# Patient Record
Sex: Female | Born: 1960
Health system: Southern US, Community
[De-identification: ages and names within clinical notes are randomized; demographics above are authoritative.]

## PROBLEM LIST (undated history)

## (undated) DIAGNOSIS — E669 Obesity, unspecified: Secondary | ICD-10-CM

## (undated) DIAGNOSIS — T7840XA Allergy, unspecified, initial encounter: Secondary | ICD-10-CM

## (undated) DIAGNOSIS — E782 Mixed hyperlipidemia: Secondary | ICD-10-CM

## (undated) DIAGNOSIS — D649 Anemia, unspecified: Secondary | ICD-10-CM

## (undated) DIAGNOSIS — F988 Other specified behavioral and emotional disorders with onset usually occurring in childhood and adolescence: Secondary | ICD-10-CM

## (undated) DIAGNOSIS — K219 Gastro-esophageal reflux disease without esophagitis: Secondary | ICD-10-CM

## (undated) HISTORY — DX: Gastro-esophageal reflux disease without esophagitis: K21.9

## (undated) HISTORY — DX: Obesity, unspecified: E66.9

## (undated) HISTORY — PX: COSMETIC SURGERY: SHX468

## (undated) HISTORY — DX: Anemia, unspecified: D64.9

## (undated) HISTORY — DX: Other specified behavioral and emotional disorders with onset usually occurring in childhood and adolescence: F98.8

## (undated) HISTORY — DX: Allergy, unspecified, initial encounter: T78.40XA

## (undated) HISTORY — DX: Mixed hyperlipidemia: E78.2

---

## 2000-07-14 HISTORY — PX: GASTRIC BYPASS: SHX52

## 2014-05-02 ENCOUNTER — Ambulatory Visit (INDEPENDENT_AMBULATORY_CARE_PROVIDER_SITE_OTHER): Payer: BC Managed Care – PPO | Admitting: Internal Medicine

## 2014-05-02 ENCOUNTER — Encounter: Payer: Self-pay | Admitting: Internal Medicine

## 2014-05-02 VITALS — BP 125/80 | HR 78 | Temp 98.1°F | Resp 20 | Ht 70.47 in | Wt 273.2 lb

## 2014-05-02 DIAGNOSIS — F988 Other specified behavioral and emotional disorders with onset usually occurring in childhood and adolescence: Secondary | ICD-10-CM

## 2014-05-02 DIAGNOSIS — Q845 Enlarged and hypertrophic nails: Secondary | ICD-10-CM

## 2014-05-02 DIAGNOSIS — F909 Attention-deficit hyperactivity disorder, unspecified type: Secondary | ICD-10-CM

## 2014-05-02 DIAGNOSIS — Z1239 Encounter for other screening for malignant neoplasm of breast: Secondary | ICD-10-CM

## 2014-05-02 DIAGNOSIS — IMO0001 Reserved for inherently not codable concepts without codable children: Secondary | ICD-10-CM

## 2014-05-02 DIAGNOSIS — Z0000 Encounter for general adult medical examination without abnormal findings: Secondary | ICD-10-CM | POA: Insufficient documentation

## 2014-05-02 DIAGNOSIS — E669 Obesity, unspecified: Secondary | ICD-10-CM

## 2014-05-02 DIAGNOSIS — L602 Onychogryphosis: Secondary | ICD-10-CM | POA: Insufficient documentation

## 2014-05-02 HISTORY — DX: Obesity, unspecified: E66.9

## 2014-05-02 HISTORY — DX: Other specified behavioral and emotional disorders with onset usually occurring in childhood and adolescence: F98.8

## 2014-05-02 MED ORDER — AMPHETAMINE-DEXTROAMPHETAMINE 20 MG PO TABS: ORAL_TABLET | ORAL | Status: DC

## 2014-05-02 NOTE — Progress Notes (Signed)
Patient ID: Alexandra Proctor, female   DOB: 10-Apr-1961, 53 y.o.   MRN: [id number]    Chief Complaint  Patient presents with  . Establish Care   No Known Allergies  HPI 53 y/o female patient is here to establish care.She moved from [place] in June 2015.She underwent Gastric bypass in 2003. She was seeing her gynaecologist and internist Normal pap smear- last one 5 years back. Wants a gyn established in town Denies any complaints this visit. Needs refill on her aderall  Review of Systems  Constitutional: Negative for fever, chills, weight loss, malaise/fatigue and diaphoresis.  HENT: Negative for congestion, hearing loss and sore throat.   Eyes: Negative for blurred vision, double vision and discharge. wears contact lenses Respiratory: Negative for cough, sputum production, shortness of breath and wheezing.   Cardiovascular: Negative for chest pain, palpitations, orthopnea and leg swelling.  Gastrointestinal: Negative for heartburn, nausea, vomiting, abdominal pain, melena, rectal bleed,diarrhea and constipation.  Genitourinary: Negative for dysuria, urgency, vaginal discharge, frequency and flank pain. sexually active and practices safe sex- condoms Musculoskeletal: Negative for back pain, falls, joint pain and myalgias.  Skin: Negative for itching and rash.  Neurological: Negative for dizziness, tingling, focal weakness and headaches.  Psychiatric/Behavioral: Negative for depression and nervous/anxious.  sleeps fair at night. Has hx of ADD  History reviewed. No pertinent past medical history.  Past Surgical History  Procedure Laterality Date  . Gastric bypass  2002   No current outpatient prescriptions on file prior to visit.   No current facility-administered medications on file prior to visit.   Family History  Problem Relation Age of Onset  . Cancer Father   . Breast cancer Mother   . Stroke Mother   . Asthma Brother   . Breast cancer Maternal Grandmother   . Breast cancer  Paternal Grandmother      Medication List       This list is accurate as of: 05/02/14 11:53 AM.  Always use your most recent med list.               amphetamine-dextroamphetamine 20 MG tablet  Commonly known as:  ADDERALL  1-2 by mouth twice daily     ibuprofen 200 MG tablet  Commonly known as:  ADVIL,MOTRIN  200 mg. 1 by mouth once weekly as needed     multivitamin tablet  Take 1 tablet by mouth daily.       History   Social History  . Marital Status: Divorced    Spouse Name: N/A    Number of Children: N/A  . Years of Education: N/A   Occupational History  . Not on file.   Social History Main Topics  . Smoking status: Never Smoker   . Smokeless tobacco: Not on file  . Alcohol Use: Yes     Comment: 2-4 drinks weekly   . Drug Use: Not on file  . Sexual Activity: Not on file   Other Topics Concern  . Not on file   Social History Narrative   Yes patient drinks/eats things containing caffeine    Patient lives in an apartment- one or more stories (1 person), no pets    Patient exercises (walking 3-5 times weekly)                       Physical exam BP 125/80  Pulse 78  Temp(Src) 98.1 F (36.7 C) (Oral)  Resp 20  Ht 5' 10.47" (1.79 m)  Wt 273 lb 3.2  oz (123.923 kg)  BMI 38.68 kg/m2  SpO2 99%  General- adult female in no acute distress Head- atraumatic, normocephalic Eyes- PERRLA, EOMI, no pallor, no icterus, no discharge Ears- left ear normal tympanic membrane and normal external ear canal , right ear normal tympanic membrane and normal external ear canal Neck- no lymphadenopathy, no thyromegaly, no jugular vein distension, no carotid bruit Nose- normal nasal mucosa, no maxillary sinus tenderness, no frontal sinus tenderness Mouth- normal mucus membrane, no oral thrush, normal oropharynx Cardiovascular- normal s1,s2, no murmurs/ rubs/ gallops, normal distal pulses Respiratory- bilateral clear to auscultation, no wheeze, no rhonchi, no  crackles Abdomen- bowel sounds present, soft, non tender, no organomegaly, no abdominal bruits, no guarding or rigidity, no CVA tenderness Musculoskeletal- able to move all 4 extremities, no spinal and paraspinal tenderness, steady gait, no use of assistive device, normal range of motion, no leg edema Neurological- no focal deficit, normal reflexes, normal muscle strength, normal sensation to fine touch and vibration Skin- warm and dry, has thickened great toe nail in right foot Psychiatry- alert and oriented to person, place and time, normal mood and affect Pelvic and breast exam refused, to be done by Gyn  Labs- none  No prior records for review  Assessment/plan  1. Annual physical exam the patient was counseled regarding the appropriate use of alcohol, regular self-examination of the breasts on a monthly basis, prevention of dental and periodontal disease, diet, regular sustained exercise for at least 30 minutes 5 times per week, routine screening interval for mammogram as recommended by the [facility] and ACOG, the proper use of sunscreen and protective clothing, tobacco use, and recommended schedule for GI hemoccult testing, colonoscopy, cholesterol, thyroid and diabetes screening. fobt card provided. dexa scan and mammogram scheduled. Does not want influenza vaccine - Ambulatory referral to Gynecology - CMP - Lipid Panel - TSH - Vitamin D, 1,25-dihydroxy - Hemoglobin A1c - CBC with Differential - DG Bone Density; Future  2. Obesity Walking for exercise,encouraged cardiovascular exercise. Assess with dexa scan. Dietary counselling provided - DG Bone Density; Future  3. ADD (attention deficit disorder) Continue adderall  4. Thickened nail Avoid nail paint application, allow aeration in feet and foot care. If no improvement, consider treatment for fungal toe infection  5. Breast cancer screening - MM Digital Screening; Future

## 2014-05-03 LAB — CBC WITH DIFFERENTIAL/PLATELET
BASOS ABS: 0 10*3/uL (ref 0.0–0.2)
BASOS: 1 %
EOS ABS: 0.1 10*3/uL (ref 0.0–0.4)
Eos: 2 %
HCT: 42.3 % (ref 34.0–46.6)
Hemoglobin: 14.5 g/dL (ref 11.1–15.9)
IMMATURE GRANS (ABS): 0 10*3/uL (ref 0.0–0.1)
IMMATURE GRANULOCYTES: 0 %
Lymphocytes Absolute: 1.3 10*3/uL (ref 0.7–3.1)
Lymphs: 25 %
MCH: 30.8 pg (ref 26.6–33.0)
MCHC: 34.3 g/dL (ref 31.5–35.7)
MCV: 90 fL (ref 79–97)
MONOS ABS: 0.5 10*3/uL (ref 0.1–0.9)
Monocytes: 9 %
NEUTROS PCT: 63 %
Neutrophils Absolute: 3.2 10*3/uL (ref 1.4–7.0)
RBC: 4.71 x10E6/uL (ref 3.77–5.28)
RDW: 14.3 % (ref 12.3–15.4)
WBC: 5.1 10*3/uL (ref 3.4–10.8)

## 2014-05-03 LAB — VITAMIN D 1,25 DIHYDROXY: VIT D 1 25 DIHYDROXY: 73.2 pg/mL (ref 19.9–79.3)

## 2014-05-03 LAB — LIPID PANEL
Chol/HDL Ratio: 4.2 ratio units (ref 0.0–4.4)
Cholesterol, Total: 206 mg/dL — ABNORMAL HIGH (ref 100–199)
HDL: 49 mg/dL (ref >39)
LDL CALC: 137 mg/dL — AB (ref 0–99)
Triglycerides: 101 mg/dL (ref 0–149)
VLDL CHOLESTEROL CAL: 20 mg/dL (ref 5–40)

## 2014-05-03 LAB — COMPREHENSIVE METABOLIC PANEL
ALK PHOS: 84 IU/L (ref 39–117)
ALT: 16 IU/L (ref 0–32)
AST: 12 IU/L (ref 0–40)
Albumin/Globulin Ratio: 1.7 (ref 1.1–2.5)
Albumin: 4.3 g/dL (ref 3.5–5.5)
BILIRUBIN TOTAL: 0.4 mg/dL (ref 0.0–1.2)
BUN / CREAT RATIO: 12 (ref 9–23)
BUN: 9 mg/dL (ref 6–24)
CO2: 24 mmol/L (ref 18–29)
Calcium: 9.2 mg/dL (ref 8.7–10.2)
Chloride: 106 mmol/L (ref 97–108)
Creatinine, Ser: 0.74 mg/dL (ref 0.57–1.00)
GFR, EST AFRICAN AMERICAN: 108 mL/min/{1.73_m2} (ref >59)
GFR, EST NON AFRICAN AMERICAN: 93 mL/min/{1.73_m2} (ref >59)
GLUCOSE: 95 mg/dL (ref 65–99)
Globulin, Total: 2.5 g/dL (ref 1.5–4.5)
Potassium: 4.5 mmol/L (ref 3.5–5.2)
Sodium: 148 mmol/L — ABNORMAL HIGH (ref 134–144)
TOTAL PROTEIN: 6.8 g/dL (ref 6.0–8.5)

## 2014-05-03 LAB — HEMOGLOBIN A1C
Est. average glucose Bld gHb Est-mCnc: 108 mg/dL
HEMOGLOBIN A1C: 5.4 % (ref 4.8–5.6)

## 2014-05-03 LAB — TSH: TSH: 1.91 u[IU]/mL (ref 0.450–4.500)

## 2014-05-09 ENCOUNTER — Encounter: Payer: Self-pay | Admitting: *Deleted

## 2014-05-25 ENCOUNTER — Other Ambulatory Visit: Payer: Self-pay | Admitting: Gynecology

## 2014-05-25 DIAGNOSIS — Z872 Personal history of diseases of the skin and subcutaneous tissue: Secondary | ICD-10-CM

## 2014-05-25 DIAGNOSIS — Z803 Family history of malignant neoplasm of breast: Secondary | ICD-10-CM

## 2014-05-25 DIAGNOSIS — N6313 Unspecified lump in the right breast, lower outer quadrant: Secondary | ICD-10-CM

## 2014-05-29 ENCOUNTER — Telehealth: Payer: Self-pay | Admitting: Internal Medicine

## 2014-05-29 ENCOUNTER — Telehealth: Payer: Self-pay | Admitting: *Deleted

## 2014-05-29 DIAGNOSIS — Z029 Encounter for administrative examinations, unspecified: Secondary | ICD-10-CM

## 2014-05-29 MED ORDER — AMPHETAMINE-DEXTROAMPHETAMINE 20 MG PO TABS: ORAL_TABLET | ORAL | Status: DC

## 2014-05-29 NOTE — Telephone Encounter (Signed)
Received paperwork for [employer] health provider screening form. Filled out what I could and left form for Dr. Bubba Camp to complete and sign.

## 2014-05-29 NOTE — Telephone Encounter (Signed)
Received Health Provider screening Form from patient to be completed by Dr. Bubba Camp.  Patient completed all necessary forms and they were put in the [facility] basket for completion

## 2014-05-29 NOTE — Telephone Encounter (Signed)
Patient requested refill on Adderall. Printed and signed by Dr. Sheppard Coil. Patient also wants something called in for Toenail Fungus. Please Advise.

## 2014-05-30 NOTE — Telephone Encounter (Signed)
i will need to see her in the office to assess on involvement of toe nails to decide on topical medication versus oral pill. Make appointment please

## 2014-05-30 NOTE — Telephone Encounter (Signed)
Patient Notified and Appointment given for 06/13/14

## 2014-05-31 NOTE — Telephone Encounter (Signed)
Paperwork signed by Dr. Bubba Camp. Patient Notified and paperwork copied and left up front for patient to pick up

## 2014-06-06 ENCOUNTER — Ambulatory Visit (HOSPITAL_COMMUNITY): Payer: Self-pay

## 2014-06-06 ENCOUNTER — Inpatient Hospital Stay (HOSPITAL_COMMUNITY): Admission: RE | Admit: 2014-06-06 | Payer: Self-pay | Source: Ambulatory Visit

## 2014-06-07 ENCOUNTER — Other Ambulatory Visit: Payer: Self-pay

## 2014-06-13 ENCOUNTER — Ambulatory Visit: Payer: Self-pay | Admitting: Internal Medicine

## 2014-06-13 ENCOUNTER — Encounter: Payer: Self-pay | Admitting: Internal Medicine

## 2014-06-13 DIAGNOSIS — Z0289 Encounter for other administrative examinations: Secondary | ICD-10-CM

## 2014-06-19 ENCOUNTER — Other Ambulatory Visit: Payer: Self-pay

## 2014-06-21 ENCOUNTER — Ambulatory Visit (HOSPITAL_COMMUNITY): Admission: RE | Admit: 2014-06-21 | Payer: Self-pay | Source: Ambulatory Visit

## 2014-06-27 ENCOUNTER — Other Ambulatory Visit: Payer: Self-pay | Admitting: Internal Medicine

## 2014-06-27 MED ORDER — AMPHETAMINE-DEXTROAMPHETAMINE 20 MG PO TABS: ORAL_TABLET | ORAL | Status: DC

## 2014-08-07 ENCOUNTER — Other Ambulatory Visit: Payer: Self-pay | Admitting: Internal Medicine

## 2014-08-07 MED ORDER — AMPHETAMINE-DEXTROAMPHETAMINE 20 MG PO TABS: ORAL_TABLET | ORAL | Status: DC

## 2014-08-14 ENCOUNTER — Other Ambulatory Visit: Payer: Self-pay | Admitting: Nurse Practitioner

## 2014-08-16 ENCOUNTER — Telehealth: Payer: Self-pay | Admitting: *Deleted

## 2014-08-16 NOTE — Telephone Encounter (Signed)
Called [employer] and initiated Prior Authorization for Adderall 20mg . Spoke with Lanny Hurst and medication APPROVED 07/17/14 till 08/15/2017. Case # [id number]. Member ID # [id number]. [place] Notified.

## 2014-09-05 ENCOUNTER — Encounter: Payer: Self-pay | Admitting: Internal Medicine

## 2014-09-11 ENCOUNTER — Other Ambulatory Visit: Payer: Self-pay | Admitting: Nurse Practitioner

## 2014-09-11 MED ORDER — AMPHETAMINE-DEXTROAMPHETAMINE 20 MG PO TABS: ORAL_TABLET | ORAL | Status: DC

## 2014-09-26 ENCOUNTER — Encounter: Payer: Self-pay | Admitting: Internal Medicine

## 2014-09-26 ENCOUNTER — Ambulatory Visit (INDEPENDENT_AMBULATORY_CARE_PROVIDER_SITE_OTHER): Payer: BLUE CROSS/BLUE SHIELD | Admitting: Internal Medicine

## 2014-09-26 VITALS — BP 122/78 | HR 94 | Temp 98.5°F | Resp 20 | Ht 70.0 in | Wt 269.0 lb

## 2014-09-26 DIAGNOSIS — R1031 Right lower quadrant pain: Secondary | ICD-10-CM | POA: Diagnosis not present

## 2014-09-26 DIAGNOSIS — K5900 Constipation, unspecified: Secondary | ICD-10-CM

## 2014-09-26 MED ORDER — POLYETHYLENE GLYCOL 3350 17 GM/SCOOP PO POWD: 17.0000 g | Freq: Every day | ORAL | Status: DC | PRN

## 2014-09-26 NOTE — Progress Notes (Signed)
Patient ID: Alexandra Proctor, female   DOB: 1961/03/28, 54 y.o.   MRN: [id number]    Chief Complaint  Patient presents with  . Acute Visit    Patient said shes having lower right back pain on the right side that radiates to the front   No Known Allergies  HPI 54 y/o female patient is here for acute visit. Past Sunday, she noticed sudden onset of pain in right side of her lower back and right lower quadrant. It lasted for 3-4 hours, intermittent discomfort, non radiating. Denies any numbness or tingling with it. She drank plenty of water and pain slowly resolved. She had similar pain yesterday for an hour and self resolved. No pain today.  ROS Denies dysuria, burning with urination, hematuria or dark colored urine Has not had a bowel movement after Saturday- 3 days and normal for her is bowel movement every day Appetite is good Denies nausea or vomiting Denies any fall or trauma Denies lifting anything heavy does not recall any back injury Denies fever or chills Has been passing flatus and feels bloated/ gasey  History reviewed. No pertinent past medical history.  Current Outpatient Prescriptions on File Prior to Visit  Medication Sig Dispense Refill  . amphetamine-dextroamphetamine (ADDERALL) 20 MG tablet Take 1-2 tablets by mouth twice daily for ADD 100 tablet 0  . ibuprofen (ADVIL,MOTRIN) 200 MG tablet 200 mg. 1 by mouth once weekly as needed    . Multiple Vitamin (MULTIVITAMIN) tablet Take 1 tablet by mouth daily.     No current facility-administered medications on file prior to visit.     Physical exam BP 122/78 mmHg  Pulse 94  Temp(Src) 98.5 F (36.9 C) (Oral)  Resp 20  Ht 5\' 10"  (1.778 m)  Wt 269 lb (122.018 kg)  BMI 38.60 kg/m2  SpO2 94%  Wt Readings from Last 3 Encounters:  09/26/14 269 lb (122.018 kg)  05/02/14 273 lb 3.2 oz (123.923 kg)   General- adult overweight female in no acute distress Head- atraumatic, normocephalic Eyes- PERRLA, EOMI, no pallor, no  icterus, no discharge Mouth- normal mucus membrane Cardiovascular- normal s1,s2, no murmurs Respiratory- bilateral clear to auscultation, no wheeze, no rhonchi, no crackles Abdomen- bowel sounds present, soft, distended, non tender, no organomegaly, no guarding or rigidity, no CVA tenderness Musculoskeletal- able to move all 4 extremities, no spinal and paraspinal tenderness, steady gait Neurological- no focal deficit Skin- warm and dry Psychiatry- alert and oriented to person, place and time  Assessment/plan  1. Acute right lower quadrant pain New onset, none at present. Concern for her constipation contributing to this. No urinary symptom and no musculoskeletal symptom. Another concern is of a passing kidney stone. Currently asymptomatic. Advised on taking miralax to help with her bowel movement and continue hydration. Check cmp to assess on renal and liver function. Monitor clinically. Reassess if symptoms persists or worsens, consider imaging at that point - CBC with Differential - CMP  2. Constipation, unspecified constipation type miralax daily for now as needed to help with constipation and to avoid future constipation. Sample provided

## 2014-09-27 LAB — COMPREHENSIVE METABOLIC PANEL
ALT: 14 IU/L (ref 0–32)
AST: 14 IU/L (ref 0–40)
Albumin/Globulin Ratio: 1.5 (ref 1.1–2.5)
Albumin: 4.2 g/dL (ref 3.5–5.5)
Alkaline Phosphatase: 83 IU/L (ref 39–117)
BUN/Creatinine Ratio: 18 (ref 9–23)
BUN: 14 mg/dL (ref 6–24)
Bilirubin Total: 0.3 mg/dL (ref 0.0–1.2)
CALCIUM: 9.2 mg/dL (ref 8.7–10.2)
CHLORIDE: 100 mmol/L (ref 97–108)
CO2: 24 mmol/L (ref 18–29)
Creatinine, Ser: 0.76 mg/dL (ref 0.57–1.00)
GFR calc Af Amer: 104 mL/min/{1.73_m2} (ref >59)
GFR calc non Af Amer: 90 mL/min/{1.73_m2} (ref >59)
Globulin, Total: 2.8 g/dL (ref 1.5–4.5)
Glucose: 96 mg/dL (ref 65–99)
Potassium: 4.4 mmol/L (ref 3.5–5.2)
Sodium: 139 mmol/L (ref 134–144)
Total Protein: 7 g/dL (ref 6.0–8.5)

## 2014-09-27 LAB — CBC WITH DIFFERENTIAL/PLATELET
Basophils Absolute: 0 10*3/uL (ref 0.0–0.2)
Basos: 1 %
EOS ABS: 0.1 10*3/uL (ref 0.0–0.4)
Eos: 2 %
HEMATOCRIT: 43.7 % (ref 34.0–46.6)
Hemoglobin: 15.1 g/dL (ref 11.1–15.9)
Immature Grans (Abs): 0 10*3/uL (ref 0.0–0.1)
Immature Granulocytes: 0 %
Lymphocytes Absolute: 1.4 10*3/uL (ref 0.7–3.1)
Lymphs: 22 %
MCH: 30.8 pg (ref 26.6–33.0)
MCHC: 34.6 g/dL (ref 31.5–35.7)
MCV: 89 fL (ref 79–97)
MONOS ABS: 0.6 10*3/uL (ref 0.1–0.9)
Monocytes: 9 %
NEUTROS ABS: 4.2 10*3/uL (ref 1.4–7.0)
Neutrophils Relative %: 66 %
Platelets: 249 10*3/uL (ref 150–379)
RBC: 4.9 x10E6/uL (ref 3.77–5.28)
RDW: 14.3 % (ref 12.3–15.4)
WBC: 6.3 10*3/uL (ref 3.4–10.8)

## 2014-09-28 ENCOUNTER — Encounter: Payer: Self-pay | Admitting: Internal Medicine

## 2014-10-11 ENCOUNTER — Other Ambulatory Visit: Payer: Self-pay | Admitting: Internal Medicine

## 2014-10-11 MED ORDER — AMPHETAMINE-DEXTROAMPHETAMINE 20 MG PO TABS: ORAL_TABLET | ORAL | Status: DC

## 2014-11-13 ENCOUNTER — Other Ambulatory Visit: Payer: Self-pay | Admitting: Internal Medicine

## 2014-11-13 MED ORDER — AMPHETAMINE-DEXTROAMPHETAMINE 20 MG PO TABS: ORAL_TABLET | ORAL | Status: DC

## 2014-12-15 ENCOUNTER — Other Ambulatory Visit: Payer: Self-pay | Admitting: Internal Medicine

## 2014-12-18 ENCOUNTER — Other Ambulatory Visit: Payer: Self-pay | Admitting: Internal Medicine

## 2014-12-18 MED ORDER — AMPHETAMINE-DEXTROAMPHETAMINE 20 MG PO TABS: ORAL_TABLET | ORAL | Status: DC

## 2014-12-18 NOTE — Telephone Encounter (Signed)
Patient requested and will pick up 

## 2015-01-11 ENCOUNTER — Other Ambulatory Visit: Payer: Self-pay | Admitting: Internal Medicine

## 2015-01-11 DIAGNOSIS — F988 Other specified behavioral and emotional disorders with onset usually occurring in childhood and adolescence: Secondary | ICD-10-CM

## 2015-01-12 MED ORDER — AMPHETAMINE-DEXTROAMPHETAMINE 20 MG PO TABS: ORAL_TABLET | ORAL | Status: DC

## 2015-02-14 ENCOUNTER — Other Ambulatory Visit: Payer: Self-pay | Admitting: Internal Medicine

## 2015-02-14 ENCOUNTER — Telehealth: Payer: Self-pay

## 2015-02-14 ENCOUNTER — Other Ambulatory Visit: Payer: Self-pay

## 2015-02-14 DIAGNOSIS — F988 Other specified behavioral and emotional disorders with onset usually occurring in childhood and adolescence: Secondary | ICD-10-CM

## 2015-02-14 MED ORDER — AMPHETAMINE-DEXTROAMPHETAMINE 20 MG PO TABS
ORAL_TABLET | ORAL | Status: DC
Start: 2015-02-14 — End: 2015-02-14

## 2015-02-14 MED ORDER — AMPHETAMINE-DEXTROAMPHETAMINE 20 MG PO TABS: ORAL_TABLET | ORAL | Status: DC

## 2015-02-14 NOTE — Telephone Encounter (Signed)
Called the patients home  to let her know that her one medication could not be faxed and she can pick it up at the office.

## 2015-02-22 ENCOUNTER — Telehealth: Payer: Self-pay

## 2015-02-22 NOTE — Telephone Encounter (Signed)
Received request for D-Amphetamine Salt Combo 20 mg Tab but a authorization had already been done on 12/19/14 fax copy of authorization to [facility] at [phone] ).

## 2015-03-21 ENCOUNTER — Other Ambulatory Visit: Payer: Self-pay | Admitting: Internal Medicine

## 2015-03-21 DIAGNOSIS — F988 Other specified behavioral and emotional disorders with onset usually occurring in childhood and adolescence: Secondary | ICD-10-CM

## 2015-03-21 MED ORDER — AMPHETAMINE-DEXTROAMPHETAMINE 20 MG PO TABS: ORAL_TABLET | ORAL | Status: DC

## 2015-03-21 NOTE — Telephone Encounter (Signed)
Printed Rx for pickup

## 2015-04-20 ENCOUNTER — Other Ambulatory Visit: Payer: Self-pay | Admitting: Internal Medicine

## 2015-04-23 ENCOUNTER — Other Ambulatory Visit: Payer: Self-pay | Admitting: *Deleted

## 2015-04-23 DIAGNOSIS — F988 Other specified behavioral and emotional disorders with onset usually occurring in childhood and adolescence: Secondary | ICD-10-CM

## 2015-04-23 MED ORDER — AMPHETAMINE-DEXTROAMPHETAMINE 20 MG PO TABS: ORAL_TABLET | ORAL | Status: DC

## 2015-04-23 NOTE — Telephone Encounter (Signed)
Call patient to inform her that her script will be ready after 9:00am.

## 2015-05-02 ENCOUNTER — Telehealth: Payer: Self-pay | Admitting: *Deleted

## 2015-05-02 NOTE — Telephone Encounter (Signed)
Received Prior Authorization Request from [facility] needed authorization for patient's Adderall. Called #[phone] and spoke with Abbe Amsterdam and he stated that patient is good until 08/15/2017.  ID#: [id number]

## 2015-05-08 ENCOUNTER — Encounter: Payer: Self-pay | Admitting: Internal Medicine

## 2015-05-09 ENCOUNTER — Encounter: Payer: Self-pay | Admitting: Internal Medicine

## 2015-05-11 ENCOUNTER — Encounter: Payer: Self-pay | Admitting: Internal Medicine

## 2015-05-11 ENCOUNTER — Ambulatory Visit (INDEPENDENT_AMBULATORY_CARE_PROVIDER_SITE_OTHER): Payer: BLUE CROSS/BLUE SHIELD | Admitting: Internal Medicine

## 2015-05-11 VITALS — BP 110/86 | HR 100 | Temp 98.2°F | Resp 18 | Ht 70.0 in | Wt 264.8 lb

## 2015-05-11 DIAGNOSIS — Z0000 Encounter for general adult medical examination without abnormal findings: Secondary | ICD-10-CM

## 2015-05-11 DIAGNOSIS — L84 Corns and callosities: Secondary | ICD-10-CM

## 2015-05-11 DIAGNOSIS — Z23 Encounter for immunization: Secondary | ICD-10-CM | POA: Diagnosis not present

## 2015-05-11 DIAGNOSIS — Z124 Encounter for screening for malignant neoplasm of cervix: Secondary | ICD-10-CM

## 2015-05-11 DIAGNOSIS — Z1239 Encounter for other screening for malignant neoplasm of breast: Secondary | ICD-10-CM

## 2015-05-11 DIAGNOSIS — D225 Melanocytic nevi of trunk: Secondary | ICD-10-CM | POA: Diagnosis not present

## 2015-05-11 DIAGNOSIS — F988 Other specified behavioral and emotional disorders with onset usually occurring in childhood and adolescence: Secondary | ICD-10-CM

## 2015-05-11 DIAGNOSIS — E669 Obesity, unspecified: Secondary | ICD-10-CM

## 2015-05-11 DIAGNOSIS — F909 Attention-deficit hyperactivity disorder, unspecified type: Secondary | ICD-10-CM | POA: Diagnosis not present

## 2015-05-11 NOTE — Patient Instructions (Addendum)
Encourage her to exercise 30-45 minutes 4-5 times per week. Eat a well balanced diet. Avoid smoking. Limit alcohol intake. Wear seatbelt when riding in the car. Wear sun block (SPF >50) when spending extended times outside.  Flu shot given today  Will call with Dermatology referral  Form completed and signed  Will call with lab results  Follow up in 1 yr for CPE/ECG

## 2015-05-11 NOTE — Progress Notes (Signed)
Patient ID: Alexandra Proctor, female   DOB: 21-Sep-1960, 54 y.o.   MRN: [id number] Subjective:     Alexandra Proctor is a 54 y.o. female and is here for a comprehensive physical exam. The patient reports problems - tinnitus in both ears x 1 month. No ear pain or d/c. No bleeding from ears  She has a mole on her back of unknown dueration. Her daughter noticed it. She does not know if it has changed shapes. It does not bleed  She sees psych foe ADHD mx. Currently takes adderall  She did not f/u for mammogram last year. She has not f/u with colonoscopy as she does not want to have it done  History reviewed. No pertinent past medical history. Past Surgical History  Procedure Laterality Date  . Gastric bypass  2002   Family History  Problem Relation Age of Onset  . Cancer Father   . Breast cancer Mother   . Stroke Mother   . Asthma Brother   . Breast cancer Maternal Grandmother   . Breast cancer Paternal Grandmother     Social History   Social History  . Marital Status: Divorced    Spouse Name: N/A  . Number of Children: N/A  . Years of Education: N/A   Occupational History  . Not on file.   Social History Main Topics  . Smoking status: Never Smoker   . Smokeless tobacco: Never Used  . Alcohol Use: 0.0 oz/week    0 Standard drinks or equivalent per week     Comment: 2-4 drinks weekly   . Drug Use: Not on file  . Sexual Activity: Not on file   Other Topics Concern  . Not on file   Social History Narrative   Yes patient drinks/eats things containing caffeine    Patient lives in an apartment- one or more stories (1 person), no pets    Patient exercises (walking 3-5 times weekly)                       Health Maintenance  Topic Date Due  . Hepatitis C Screening  01-26-1961  . HIV Screening  12/12/1976  . PAP SMEAR  12/13/1982  . MAMMOGRAM  12/13/2011  . COLONOSCOPY  12/13/2011  . INFLUENZA VACCINE  02/12/2015  . TETANUS/TDAP  07/14/2018   Current Outpatient  Prescriptions on File Prior to Visit  Medication Sig Dispense Refill  . amphetamine-dextroamphetamine (ADDERALL) 20 MG tablet Take 1-2 tablets by mouth twice daily for ADD 120 tablet 0  . ibuprofen (ADVIL,MOTRIN) 200 MG tablet 200 mg. 1 by mouth once weekly as needed    . Multiple Vitamin (MULTIVITAMIN) tablet Take 1 tablet by mouth daily.    . polyethylene glycol powder (GLYCOLAX/MIRALAX) powder Take 17 g by mouth daily as needed for mild constipation. Sample provided 3350 g 1   No current facility-administered medications on file prior to visit.     Review of Systems   Review of Systems  Constitutional: Negative for fever, chills and malaise/fatigue.  HENT: Positive for tinnitus. Negative for sore throat.   Eyes: Negative for blurred vision and double vision.  Respiratory: Negative for cough, shortness of breath and wheezing.   Cardiovascular: Negative for chest pain, palpitations, orthopnea and leg swelling.  Gastrointestinal: Negative for heartburn, nausea, vomiting, abdominal pain, diarrhea, constipation and blood in stool.  Genitourinary: Negative for dysuria, urgency, frequency and hematuria.  Musculoskeletal: Negative for myalgias, joint pain and falls.  Skin: Negative for rash.  mole  Neurological: Negative for dizziness, tingling, tremors, sensory change, focal weakness, seizures, loss of consciousness, weakness and headaches.  Endo/Heme/Allergies: Negative for environmental allergies. Does not bruise/bleed easily.  Psychiatric/Behavioral: Negative for depression and memory loss. The patient is not nervous/anxious and does not have insomnia.      Objective:      Physical Exam  Constitutional: She is oriented to person, place, and time and well-developed, well-nourished, and in no distress.  HENT:  Head: Normocephalic and atraumatic.  Right Ear: External ear normal.  Left Ear: External ear normal.  Mouth/Throat: Oropharynx is clear and moist. No oropharyngeal  exudate.  Eyes: Conjunctivae and EOM are normal. Pupils are equal, round, and reactive to light. No scleral icterus.  Neck: Normal range of motion. Neck supple. Carotid bruit is not present. No tracheal deviation present. No thyromegaly present.  Cardiovascular: Normal rate, regular rhythm, normal heart sounds and intact distal pulses.  Exam reveals no gallop and no friction rub.   No murmur heard. Pulmonary/Chest: Effort normal and breath sounds normal. She has no wheezes. She has no rhonchi. She has no rales. She exhibits no tenderness. Right breast exhibits tenderness (upper quadrant with palpable compressible cysts). Right breast exhibits no inverted nipple, no mass, no nipple discharge and no skin change. Left breast exhibits no inverted nipple, no mass, no nipple discharge, no skin change and no tenderness. Breasts are symmetrical.  Abdominal: Soft. Bowel sounds are normal. She exhibits no distension and no mass. There is no hepatosplenomegaly. There is no tenderness. There is no rebound and no guarding.  obese  Genitourinary: Vagina normal, uterus normal, cervix normal, right adnexa normal and left adnexa normal. Rectal exam shows no external hemorrhoid, no internal hemorrhoid, no fissure, no laceration, no mass and no tenderness. Guaiac negative stool. No vaginal discharge found.  Musculoskeletal: Normal range of motion.  Lymphadenopathy:    She has no cervical adenopathy.  Neurological: She is alert and oriented to person, place, and time. She has normal reflexes. Gait normal.  Skin: Skin is warm, dry and intact. Lesion noted. No rash noted.     Large, TTP right plantar callus > left. No ulceration  Psychiatric: Mood, memory, affect and judgment normal.      Assessment:    Healthy female exam.       ICD-9-CM ICD-10-CM   1. Well adult exam V70.0 Z00.00 CBC with Differential     CMP     Lipid Panel     TSH     Urinalysis with Reflex Microscopic  2. ADD (attention deficit  disorder) 314.00 F90.9   3. Obesity 278.00 E66.9   4. Nevus of back 216.5 D22.5 Ambulatory referral to Dermatology  5. Callus of foot 700 L84    large TTP right > left  6. Breast cancer screening V76.10 Z12.39 MS DIGITAL SCREENING BILATERAL  7. Encounter for immunization Z23 Z23   8. Cervical cancer screening V76.2 Z12.4 PAP, Image Guided [[facility], [facility]]    Plan:     See After Visit Summary for Counseling Recommendations   Pt is not UTD on health maintenance. She declined colonoscopy. Vaccinations are UTD. Pt maintains a healthy lifestyle. Encouraged pt to exercise 30-45 minutes 4-5 times per week. Eat a well balanced diet. Avoid smoking. Limit alcohol intake. Wear seatbelt when riding in the car. Wear sun block (SPF >50) when spending extended times outside.  May need podiatry eval for calluses. She declined today  Flu shot given today  Will call with Dermatology referral  Form completed and signed for insurance  Follow up in 1 yr for CPE/ECG  F/u with psych for ADD [facility] S. Perlie Gold  [facility] [address] [place], [place] [address] [phone] Cell (Monday-Friday 8 AM - 5 PM) [phone] After 5 PM and follow prompts

## 2015-05-16 ENCOUNTER — Other Ambulatory Visit: Payer: BLUE CROSS/BLUE SHIELD

## 2015-05-16 LAB — PAP IG (IMAGE GUIDED): PAP Smear Comment: 0

## 2015-05-25 ENCOUNTER — Other Ambulatory Visit: Payer: Self-pay | Admitting: Internal Medicine

## 2015-05-25 DIAGNOSIS — F988 Other specified behavioral and emotional disorders with onset usually occurring in childhood and adolescence: Secondary | ICD-10-CM

## 2015-05-25 MED ORDER — AMPHETAMINE-DEXTROAMPHETAMINE 20 MG PO TABS: ORAL_TABLET | ORAL | Status: DC

## 2015-05-25 NOTE — Telephone Encounter (Signed)
Patient requested and Dr. Mariea Clonts approved. Printed for pick up

## 2015-06-29 ENCOUNTER — Other Ambulatory Visit: Payer: Self-pay | Admitting: Internal Medicine

## 2015-07-03 ENCOUNTER — Other Ambulatory Visit: Payer: Self-pay

## 2015-07-03 DIAGNOSIS — F988 Other specified behavioral and emotional disorders with onset usually occurring in childhood and adolescence: Secondary | ICD-10-CM

## 2015-07-03 MED ORDER — AMPHETAMINE-DEXTROAMPHETAMINE 20 MG PO TABS: ORAL_TABLET | ORAL | Status: DC

## 2015-08-29 ENCOUNTER — Other Ambulatory Visit: Payer: Self-pay | Admitting: Nurse Practitioner

## 2015-08-29 DIAGNOSIS — F988 Other specified behavioral and emotional disorders with onset usually occurring in childhood and adolescence: Secondary | ICD-10-CM

## 2015-08-29 MED ORDER — AMPHETAMINE-DEXTROAMPHETAMINE 20 MG PO TABS
ORAL_TABLET | ORAL | Status: DC
Start: 2015-08-29 — End: 2015-08-30

## 2015-08-30 ENCOUNTER — Other Ambulatory Visit: Payer: Self-pay

## 2015-08-30 DIAGNOSIS — F988 Other specified behavioral and emotional disorders with onset usually occurring in childhood and adolescence: Secondary | ICD-10-CM

## 2015-08-30 MED ORDER — AMPHETAMINE-DEXTROAMPHETAMINE 20 MG PO TABS
ORAL_TABLET | ORAL | Status: DC
Start: 2015-08-30 — End: 2015-10-29

## 2015-08-31 ENCOUNTER — Other Ambulatory Visit: Payer: Self-pay | Admitting: Internal Medicine

## 2015-08-31 DIAGNOSIS — N644 Mastodynia: Secondary | ICD-10-CM

## 2015-08-31 DIAGNOSIS — N63 Unspecified lump in unspecified breast: Secondary | ICD-10-CM

## 2015-09-18 ENCOUNTER — Encounter: Payer: Self-pay | Admitting: Internal Medicine

## 2015-10-08 ENCOUNTER — Telehealth: Payer: Self-pay | Admitting: Internal Medicine

## 2015-10-08 NOTE — Telephone Encounter (Signed)
noted 

## 2015-10-08 NOTE — Telephone Encounter (Signed)
FYI,  Several attempts have been made by our office & [facility] to schedule appointments for patient for Mammogram & Bone Density. Patient not returning calls. Letter was mailed to patient 09/18/15 with no response

## 2015-10-29 ENCOUNTER — Other Ambulatory Visit: Payer: Self-pay | Admitting: Internal Medicine

## 2015-10-29 ENCOUNTER — Other Ambulatory Visit: Payer: Self-pay | Admitting: *Deleted

## 2015-10-29 DIAGNOSIS — F988 Other specified behavioral and emotional disorders with onset usually occurring in childhood and adolescence: Secondary | ICD-10-CM

## 2015-10-29 MED ORDER — AMPHETAMINE-DEXTROAMPHETAMINE 20 MG PO TABS: ORAL_TABLET | ORAL | Status: DC

## 2015-10-29 NOTE — Telephone Encounter (Signed)
Last filled 08/2015, ok to fill?

## 2015-10-29 NOTE — Telephone Encounter (Signed)
Dr. Eulas Post says to only give 22 tablets. Enough to get her though until her appointment on 11/09/2015.

## 2015-10-29 NOTE — Telephone Encounter (Signed)
Prescription was only filled for 22 tablets to last until next scheduled appointment. Patient must come to appointment on 11/09/2015 in order to have another refill.   Patient was called and informed that she must keep next scheduled appointment for additional refills. Patient stated that she understood and that she would keep next appointment.  Prescription was placed in front cabinet for pick up.

## 2015-11-09 ENCOUNTER — Ambulatory Visit: Payer: BLUE CROSS/BLUE SHIELD | Admitting: Internal Medicine

## 2015-11-21 ENCOUNTER — Ambulatory Visit (INDEPENDENT_AMBULATORY_CARE_PROVIDER_SITE_OTHER): Payer: BLUE CROSS/BLUE SHIELD | Admitting: Internal Medicine

## 2015-11-21 ENCOUNTER — Encounter: Payer: Self-pay | Admitting: Internal Medicine

## 2015-11-21 VITALS — BP 128/78 | HR 95 | Temp 98.1°F | Resp 18 | Ht 70.0 in | Wt 281.6 lb

## 2015-11-21 DIAGNOSIS — F988 Other specified behavioral and emotional disorders with onset usually occurring in childhood and adolescence: Secondary | ICD-10-CM

## 2015-11-21 DIAGNOSIS — F909 Attention-deficit hyperactivity disorder, unspecified type: Secondary | ICD-10-CM | POA: Diagnosis not present

## 2015-11-21 MED ORDER — AMPHETAMINE-DEXTROAMPHETAMINE 20 MG PO TABS: ORAL_TABLET | ORAL | Status: DC

## 2015-11-21 NOTE — Patient Instructions (Signed)
Continue current medications as ordered  Follow up in 6 mos CPE, fasting

## 2015-11-21 NOTE — Progress Notes (Signed)
Patient ID: Alexandra Proctor, female   DOB: Jul 14, 1961, 55 y.o.   MRN: [id number]    Location:    [facility]   Place of Service:  OFFICE   Chief Complaint  Patient presents with  . Medical Management of Chronic Issues    HPI:  55 yo female see today for f/u ADD. She needs RF on adderall. Adult ADHD self report scale completed. She does not see  Psych but has previously had a thorough w/u.  No past medical history on file.  Past Surgical History  Procedure Laterality Date  . Gastric bypass  2002    Patient Care Team: Gildardo Cranker, DO as PCP - General (Internal Medicine)  Social History   Social History  . Marital Status: Divorced    Spouse Name: N/A  . Number of Children: N/A  . Years of Education: N/A   Occupational History  . Not on file.   Social History Main Topics  . Smoking status: Never Smoker   . Smokeless tobacco: Never Used  . Alcohol Use: 0.0 oz/week    0 Standard drinks or equivalent per week     Comment: 2-4 drinks weekly   . Drug Use: Not on file  . Sexual Activity: Not on file   Other Topics Concern  . Not on file   Social History Narrative   Yes patient drinks/eats things containing caffeine    Patient lives in an apartment- one or more stories (1 person), no pets    Patient exercises (walking 3-5 times weekly)                         reports that she has never smoked. She has never used smokeless tobacco. She reports that she drinks alcohol. Her drug history is not on file.  No Known Allergies  Medications: Patient's Medications  New Prescriptions   No medications on file  Previous Medications   AMPHETAMINE-DEXTROAMPHETAMINE (ADDERALL) 20 MG TABLET    Take 1-2 tablets by mouth twice daily for ADD   IBUPROFEN (ADVIL,MOTRIN) 200 MG TABLET    200 mg. 1 by mouth once weekly as needed   MULTIPLE VITAMIN (MULTIVITAMIN) TABLET    Take 1 tablet by mouth daily.   POLYETHYLENE GLYCOL POWDER (GLYCOLAX/MIRALAX) POWDER    Take 17 g by mouth daily as  needed for mild constipation. Sample provided  Modified Medications   No medications on file  Discontinued Medications   No medications on file    Review of Systems  Psychiatric/Behavioral: Positive for sleep disturbance and decreased concentration. Negative for hallucinations, behavioral problems, confusion, self-injury, dysphoric mood and agitation. The patient is not nervous/anxious and is not hyperactive.   All other systems reviewed and are negative.   Filed Vitals:   11/21/15 1505  BP: 128/78  Pulse: 95  Temp: 98.1 F (36.7 C)  TempSrc: Oral  Resp: 18  Height: 5\' 10"  (1.778 m)  Weight: 281 lb 9.6 oz (127.733 kg)  SpO2: 94%   Body mass index is 40.41 kg/(m^2).  Physical Exam  Constitutional: She is oriented to person, place, and time. She appears well-developed and well-nourished.  Cardiovascular: Normal rate, regular rhythm and intact distal pulses.  Exam reveals no gallop and no friction rub.   No murmur heard. Pulmonary/Chest: No respiratory distress. She has no wheezes. She has no rales. She exhibits no tenderness.  Neurological: She is alert and oriented to person, place, and time.  Skin: Skin is warm and dry. No  rash noted.  Psychiatric: She has a normal mood and affect. Her behavior is normal. Thought content normal.     Labs reviewed: No visits with results within 3 Month(s) from this visit. Latest known visit with results is:  Office Visit on 05/11/2015  Component Date Value Ref Range Status  . DIAGNOSIS: 05/11/2015 Comment*  Final   Comment: EPITHELIAL CELL ABNORMALITY. ATYPICAL SQUAMOUS CELLS OF UNDETERMINED SIGNIFICANCE.   Marland Kitchen Specimen adequacy: 05/11/2015 Comment   Final   Satisfactory for evaluation. No endocervical component is identified.  Marland Kitchen CLINICIAN PROVIDED ICD10: 05/11/2015 Comment   Final   Z12.4  . Performed by: 05/11/2015 Comment   Final   Claudie Fisherman, Cytotechnologist (ASCP)  . Electronically signed by: 05/11/2015 Comment   Final    Franco Collet, MD, Pathologist  . PAP SMEAR COMMENT 05/11/2015 .   Final  . PATHOLOGIST PROVIDED ICD10: 05/11/2015 Comment   Final   R87.610  . Note: 05/11/2015 Comment   Final   Comment: The Pap smear is a screening test designed to aid in the detection of premalignant and malignant conditions of the uterine cervix.  It is not a diagnostic procedure and should not be used as the sole means of detecting cervical cancer.  Both false-positive and false-negative reports do occur.   . Test Methodology 05/11/2015 Comment   Final   Comment: This liquid based ThinPrep(R) pap test was screened with the use of an image guided system.     No results found.   Assessment/Plan   ICD-9-CM ICD-10-CM   1. Attention-deficit disorder 314.00 F90.9 amphetamine-dextroamphetamine (ADDERALL) 20 MG tablet   Continue current medications as ordered  Follow up in 6 mos CPE, fasting   Morene Cecilio S. Perlie Gold  [facility] [address] [place],  [address] [phone] Cell (Monday-Friday 8 AM - 5 PM) [phone] After 5 PM and follow prompts

## 2015-12-24 ENCOUNTER — Other Ambulatory Visit: Payer: Self-pay | Admitting: Internal Medicine

## 2015-12-24 DIAGNOSIS — F988 Other specified behavioral and emotional disorders with onset usually occurring in childhood and adolescence: Secondary | ICD-10-CM

## 2015-12-24 MED ORDER — AMPHETAMINE-DEXTROAMPHETAMINE 20 MG PO TABS: ORAL_TABLET | ORAL | Status: DC

## 2016-02-11 ENCOUNTER — Other Ambulatory Visit: Payer: Self-pay | Admitting: Nurse Practitioner

## 2016-02-11 DIAGNOSIS — F988 Other specified behavioral and emotional disorders with onset usually occurring in childhood and adolescence: Secondary | ICD-10-CM

## 2016-02-11 MED ORDER — AMPHETAMINE-DEXTROAMPHETAMINE 20 MG PO TABS: ORAL_TABLET | ORAL | 0 refills | Status: DC

## 2016-03-07 ENCOUNTER — Other Ambulatory Visit: Payer: Self-pay

## 2016-03-07 DIAGNOSIS — Z0000 Encounter for general adult medical examination without abnormal findings: Secondary | ICD-10-CM

## 2016-03-26 ENCOUNTER — Other Ambulatory Visit: Payer: Self-pay | Admitting: Nurse Practitioner

## 2016-03-26 DIAGNOSIS — F988 Other specified behavioral and emotional disorders with onset usually occurring in childhood and adolescence: Secondary | ICD-10-CM

## 2016-03-26 MED ORDER — AMPHETAMINE-DEXTROAMPHETAMINE 20 MG PO TABS: ORAL_TABLET | ORAL | 0 refills | Status: DC

## 2016-03-26 NOTE — Telephone Encounter (Signed)
Did not see in note that the patient wants to pick Rx up in the office, had printed the first Rx on white paper. Re-printed on blue paper.

## 2016-05-02 ENCOUNTER — Other Ambulatory Visit: Payer: Self-pay | Admitting: Internal Medicine

## 2016-05-02 ENCOUNTER — Telehealth: Payer: Self-pay

## 2016-05-02 DIAGNOSIS — F988 Other specified behavioral and emotional disorders with onset usually occurring in childhood and adolescence: Secondary | ICD-10-CM

## 2016-05-02 MED ORDER — AMPHETAMINE-DEXTROAMPHETAMINE 20 MG PO TABS: ORAL_TABLET | ORAL | 0 refills | Status: DC

## 2016-05-02 NOTE — Telephone Encounter (Signed)
Left message for patient to return call for medication prescription pick up

## 2016-05-23 ENCOUNTER — Other Ambulatory Visit: Payer: BLUE CROSS/BLUE SHIELD

## 2016-05-23 ENCOUNTER — Telehealth: Payer: Self-pay

## 2016-05-23 DIAGNOSIS — Z0000 Encounter for general adult medical examination without abnormal findings: Secondary | ICD-10-CM

## 2016-05-23 LAB — CBC WITH DIFFERENTIAL/PLATELET
BASOS ABS: 67 {cells}/uL (ref 0–200)
Basophils Relative: 1 %
EOS ABS: 804 {cells}/uL — AB (ref 15–500)
Eosinophils Relative: 12 %
HCT: 41.7 % (ref 35.0–45.0)
HEMOGLOBIN: 14.2 g/dL (ref 11.7–15.5)
LYMPHS ABS: 1742 {cells}/uL (ref 850–3900)
Lymphocytes Relative: 26 %
MCH: 30.1 pg (ref 27.0–33.0)
MCHC: 34.1 g/dL (ref 32.0–36.0)
MCV: 88.5 fL (ref 80.0–100.0)
MPV: 9.5 fL (ref 7.5–12.5)
Monocytes Absolute: 402 cells/uL (ref 200–950)
Monocytes Relative: 6 %
NEUTROS ABS: 3685 {cells}/uL (ref 1500–7800)
NEUTROS PCT: 55 %
Platelets: 238 10*3/uL (ref 140–400)
RBC: 4.71 MIL/uL (ref 3.80–5.10)
RDW: 14.4 % (ref 11.0–15.0)
WBC: 6.7 10*3/uL (ref 3.8–10.8)

## 2016-05-23 NOTE — Telephone Encounter (Signed)
Left voicemail for patient to return call regarding her bringing her urine in today by 4:15pm per Linus Orn.

## 2016-05-24 LAB — COMPLETE METABOLIC PANEL WITH GFR
ALBUMIN: 3.8 g/dL (ref 3.6–5.1)
ALK PHOS: 75 U/L (ref 33–130)
ALT: 18 U/L (ref 6–29)
AST: 15 U/L (ref 10–35)
BILIRUBIN TOTAL: 0.4 mg/dL (ref 0.2–1.2)
BUN: 13 mg/dL (ref 7–25)
CALCIUM: 8.1 mg/dL — AB (ref 8.6–10.4)
CO2: 23 mmol/L (ref 20–31)
Chloride: 107 mmol/L (ref 98–110)
Creat: 0.53 mg/dL (ref 0.50–1.05)
Glucose, Bld: 102 mg/dL — ABNORMAL HIGH (ref 65–99)
Potassium: 3.8 mmol/L (ref 3.5–5.3)
Sodium: 140 mmol/L (ref 135–146)
TOTAL PROTEIN: 6.5 g/dL (ref 6.1–8.1)

## 2016-05-24 LAB — URINALYSIS, ROUTINE W REFLEX MICROSCOPIC

## 2016-05-24 LAB — LIPID PANEL
CHOLESTEROL: 212 mg/dL — AB (ref <200)
HDL: 50 mg/dL — ABNORMAL LOW (ref >50)
LDL CALC: 145 mg/dL — AB (ref <100)
TRIGLYCERIDES: 86 mg/dL (ref <150)
Total CHOL/HDL Ratio: 4.2 Ratio (ref <5.0)
VLDL: 17 mg/dL (ref <30)

## 2016-05-24 LAB — TSH: TSH: 3.06 m[IU]/L

## 2016-05-28 ENCOUNTER — Encounter: Payer: BLUE CROSS/BLUE SHIELD | Admitting: Internal Medicine

## 2016-05-28 ENCOUNTER — Encounter: Payer: Self-pay | Admitting: Internal Medicine

## 2016-05-29 ENCOUNTER — Other Ambulatory Visit: Payer: Self-pay

## 2016-05-30 ENCOUNTER — Telehealth: Payer: Self-pay

## 2016-05-30 NOTE — Telephone Encounter (Signed)
Left voicemail for patient to return call, need her to come in for urinalysis

## 2016-06-18 ENCOUNTER — Other Ambulatory Visit: Payer: Self-pay | Admitting: Internal Medicine

## 2016-06-18 DIAGNOSIS — F988 Other specified behavioral and emotional disorders with onset usually occurring in childhood and adolescence: Secondary | ICD-10-CM

## 2016-07-15 ENCOUNTER — Other Ambulatory Visit: Payer: Self-pay | Admitting: *Deleted

## 2016-07-15 MED ORDER — AMPHETAMINE-DEXTROAMPHETAMINE 20 MG PO TABS: ORAL_TABLET | ORAL | 0 refills | Status: DC

## 2016-07-15 NOTE — Telephone Encounter (Signed)
Patient called requesting refill on her Adderall. Patient has not been seen since Bane Hagy and no upcoming appointment. Informed patient that she will need an appointment. Scheduled one for 07/30/16 and Rx given to last till her appointment.

## 2016-07-30 ENCOUNTER — Ambulatory Visit: Payer: Self-pay | Admitting: Internal Medicine

## 2016-08-08 ENCOUNTER — Other Ambulatory Visit: Payer: Self-pay | Admitting: *Deleted

## 2016-08-08 ENCOUNTER — Telehealth: Payer: Self-pay

## 2016-08-08 MED ORDER — AMPHETAMINE-DEXTROAMPHETAMINE 20 MG PO TABS: ORAL_TABLET | ORAL | 0 refills | Status: DC

## 2016-08-08 NOTE — Telephone Encounter (Signed)
I called patient to let her know that she has a prescription ready to be picked up at the office.   Rx was placed in filing cabinet at front desk.  

## 2016-08-08 NOTE — Telephone Encounter (Signed)
Patient requested. Appointment had to be rescheduled due to the snow until 08/20/16.

## 2016-08-20 ENCOUNTER — Encounter: Payer: Self-pay | Admitting: Internal Medicine

## 2016-08-20 ENCOUNTER — Ambulatory Visit (INDEPENDENT_AMBULATORY_CARE_PROVIDER_SITE_OTHER): Payer: BLUE CROSS/BLUE SHIELD | Admitting: Internal Medicine

## 2016-08-20 VITALS — BP 128/80 | HR 92 | Temp 98.0°F | Ht 70.0 in | Wt 303.8 lb

## 2016-08-20 DIAGNOSIS — E782 Mixed hyperlipidemia: Secondary | ICD-10-CM | POA: Diagnosis not present

## 2016-08-20 DIAGNOSIS — F988 Other specified behavioral and emotional disorders with onset usually occurring in childhood and adolescence: Secondary | ICD-10-CM | POA: Diagnosis not present

## 2016-08-20 DIAGNOSIS — E6609 Other obesity due to excess calories: Secondary | ICD-10-CM

## 2016-08-20 DIAGNOSIS — IMO0001 Reserved for inherently not codable concepts without codable children: Secondary | ICD-10-CM

## 2016-08-20 DIAGNOSIS — Z6841 Body Mass Index (BMI) 40.0 and over, adult: Secondary | ICD-10-CM

## 2016-08-20 DIAGNOSIS — Z23 Encounter for immunization: Secondary | ICD-10-CM

## 2016-08-20 HISTORY — DX: Mixed hyperlipidemia: E78.2

## 2016-08-20 MED ORDER — AMPHETAMINE-DEXTROAMPHETAMINE 20 MG PO TABS: ORAL_TABLET | ORAL | 0 refills | Status: DC

## 2016-08-20 NOTE — Progress Notes (Signed)
Patient ID: Alexandra Proctor, female   DOB: 1961/02/28, 56 y.o.   MRN: [id number]    Location:  [facility] Place of Service: OFFICE  Chief Complaint  Patient presents with  . Medication Management    HPI:  56 yo female seen today for f/u. She reports feeling well overall. No concerns. She has difficulty focusing without taking adderall.  ADD - She needs RF on adderall. Adult ADHD self report scale completed. She does not see Psych but has previously had a thorough w/u.  Obesity - current weight while wearing jacket. She declines re-weight. BMI 43.59  Past Medical History:  Diagnosis Date  . ADD (attention deficit disorder) 05/02/2014  . Mixed hyperlipidemia 08/20/2016  . Obesity 05/02/2014    Past Surgical History:  Procedure Laterality Date  . GASTRIC BYPASS  2002    Patient Care Team: Gildardo Cranker, DO as PCP - General (Internal Medicine)  Social History   Social History  . Marital status: Divorced    Spouse name: N/A  . Number of children: N/A  . Years of education: N/A   Occupational History  . Not on file.   Social History Main Topics  . Smoking status: Never Smoker  . Smokeless tobacco: Never Used  . Alcohol use 0.0 oz/week     Comment: 2-4 drinks weekly   . Drug use: Unknown  . Sexual activity: Not on file   Other Topics Concern  . Not on file   Social History Narrative   Yes patient drinks/eats things containing caffeine    Patient lives in an apartment- one or more stories (1 person), no pets    Patient exercises (walking 3-5 times weekly)                         reports that she has never smoked. She has never used smokeless tobacco. She reports that she drinks alcohol. Her drug history is not on file.  Family History  Problem Relation Age of Onset  . Cancer Father   . Breast cancer Mother   . Stroke Mother   . Breast cancer Maternal Grandmother   . Breast cancer Paternal Grandmother   . Asthma Brother    Family Status  Relation Status  .  Father Deceased  . Mother Alive  . Sister Alive  . Brother Alive  . Brother Alive  . Daughter Alive  . Daughter Alive  . Maternal Grandmother Deceased at age 51  . Paternal Grandmother Deceased at age 75  . Brother      No Known Allergies  Medications: Patient's Medications  New Prescriptions   No medications on file  Previous Medications   IBUPROFEN (ADVIL,MOTRIN) 200 MG TABLET    200 mg. 1 by mouth once weekly as needed   MULTIPLE VITAMIN (MULTIVITAMIN) TABLET    Take 1 tablet by mouth daily.   POLYETHYLENE GLYCOL POWDER (GLYCOLAX/MIRALAX) POWDER    Take 17 g by mouth daily as needed for mild constipation. Sample provided  Modified Medications   Modified Medication Previous Medication   AMPHETAMINE-DEXTROAMPHETAMINE (ADDERALL) 20 MG TABLET amphetamine-dextroamphetamine (ADDERALL) 20 MG tablet      Take 1-2 tablets by mouth twice daily for ADD    Take 1-2 tablets by mouth twice daily for ADD  Discontinued Medications   No medications on file    Review of Systems  Constitutional: Negative for activity change, appetite change, chills, diaphoresis, fatigue and fever.  HENT: Negative for ear pain and sore throat.  Eyes: Negative for visual disturbance.  Respiratory: Negative for cough, chest tightness and shortness of breath.   Cardiovascular: Negative for chest pain, palpitations and leg swelling.  Gastrointestinal: Negative for abdominal pain, blood in stool, constipation, diarrhea, nausea and vomiting.  Genitourinary: Negative for dysuria.  Musculoskeletal: Negative for arthralgias.  Neurological: Negative for dizziness, tremors, numbness and headaches.  Psychiatric/Behavioral: Negative for sleep disturbance. The patient is not nervous/anxious.     Vitals:   08/20/16 1114  BP: 128/80  Pulse: 92  Temp: 98 F (36.7 C)  TempSrc: Oral  SpO2: 98%  Weight: (!) 303 lb 12.8 oz (137.8 kg)  Height: _0  (1.778 m)   Body mass index is 43.59 kg/m.  Physical Exam    Constitutional: She is oriented to person, place, and time. She appears well-developed and well-nourished.  HENT:  Mouth/Throat: Oropharynx is clear and moist. No oropharyngeal exudate.  Eyes: Pupils are equal, round, and reactive to light. No scleral icterus.  Neck: Neck supple. Carotid bruit is not present. No tracheal deviation present. No thyromegaly present.  Cardiovascular: Normal rate, regular rhythm, normal heart sounds and intact distal pulses.  Exam reveals no gallop and no friction rub.   No murmur heard. No LE edema b/l. no calf TTP.   Pulmonary/Chest: Effort normal and breath sounds normal. No stridor. No respiratory distress. She has no wheezes. She has no rales.  Abdominal: Soft. Bowel sounds are normal. She exhibits no distension and no mass. There is no hepatomegaly. There is no tenderness. There is no rebound and no guarding.  obesity  Musculoskeletal: She exhibits edema.  Lymphadenopathy:    She has no cervical adenopathy.  Neurological: She is alert and oriented to person, place, and time.  Skin: Skin is warm and dry. No rash noted.  Psychiatric: She has a normal mood and affect. Her behavior is normal. Judgment and thought content normal.     Labs reviewed: Appointment on 05/23/2016  Component Date Value Ref Range Status  . WBC 05/23/2016 6.7  3.8 - 10.8 K/uL Final  . RBC 05/23/2016 4.71  3.80 - 5.10 MIL/uL Final  . Hemoglobin 05/23/2016 14.2  11.7 - 15.5 g/dL Final  . HCT 05/23/2016 41.7  35.0 - 45.0 % Final  . MCV 05/23/2016 88.5  80.0 - 100.0 fL Final  . MCH 05/23/2016 30.1  27.0 - 33.0 pg Final  . MCHC 05/23/2016 34.1  32.0 - 36.0 g/dL Final  . RDW 05/23/2016 14.4  11.0 - 15.0 % Final  . Platelets 05/23/2016 238  140 - 400 K/uL Final  . MPV 05/23/2016 9.5  7.5 - 12.5 fL Final  . Neutro Abs 05/23/2016 3685  1,500 - 7,800 cells/uL Final  . Lymphs Abs 05/23/2016 1742  850 - 3,900 cells/uL Final  . Monocytes Absolute 05/23/2016 402  200 - 950 cells/uL Final   . Eosinophils Absolute 05/23/2016 804* 15 - 500 cells/uL Final  . Basophils Absolute 05/23/2016 67  0 - 200 cells/uL Final  . Neutrophils Relative % 05/23/2016 55  % Final  . Lymphocytes Relative 05/23/2016 26  % Final  . Monocytes Relative 05/23/2016 6  % Final  . Eosinophils Relative 05/23/2016 12  % Final  . Basophils Relative 05/23/2016 1  % Final  . Smear Review 05/23/2016 Criteria for review not met   Final  . Sodium 05/23/2016 140  135 - 146 mmol/L Final  . Potassium 05/23/2016 3.8  3.5 - 5.3 mmol/L Final  . Chloride 05/23/2016 107  98 - 110  mmol/L Final  . CO2 05/23/2016 23  20 - 31 mmol/L Final  . Glucose, Bld 05/23/2016 102* 65 - 99 mg/dL Final  . BUN 05/23/2016 13  7 - 25 mg/dL Final  . Creat 05/23/2016 0.53  0.50 - 1.05 mg/dL Final   Comment:   For patients > or = 56 years of age: The upper reference limit for Creatinine is approximately 13% higher for people identified as African-American.     . Total Bilirubin 05/23/2016 0.4  0.2 - 1.2 mg/dL Final  . Alkaline Phosphatase 05/23/2016 75  33 - 130 U/L Final  . AST 05/23/2016 15  10 - 35 U/L Final  . ALT 05/23/2016 18  6 - 29 U/L Final  . Total Protein 05/23/2016 6.5  6.1 - 8.1 g/dL Final  . Albumin 05/23/2016 3.8  3.6 - 5.1 g/dL Final  . Calcium 05/23/2016 8.1* 8.6 - 10.4 mg/dL Final  . GFR, Est African American 05/23/2016 >89  >=60 mL/min Final  . GFR, Est Non African American 05/23/2016 >89  >=60 mL/min Final  . TSH 05/23/2016 3.06  mIU/L Final   Comment:   Reference Range   > or = 20 Years  0.40-4.50   Pregnancy Range First trimester  0.26-2.66 Second trimester 0.55-2.73 Third trimester  0.43-2.91     . Color, Urine 05/23/2016 CANCELED  YELLOW Final   Comment: The preferred specimen for urinalysis testing is the [employer] urine collection tube. Effective 04/05/2016, [employer], a [employer], will reject any urine cup received that is not transferred into the  preferred Stockwell(R) tube. Using this device, specimen components remain stable in transit up to 72 hours at ambient temperatures. Urine may be collected in a clean, unused cup and transferred to the yellow cap transfer tube. Supply order number is: [id number]. If you have any questions, please contact your [facility] Account Representative directly, or call our Customer Service Department at [phone]. Test not performed, no urine was received.    Result canceled by the ancillary   . APPearance 05/23/2016 CANCELED  CLEAR Final  . Specific Gravity, Urine 05/23/2016 CANCELED  1.001 - 1.035 Final  . pH 05/23/2016 CANCELED  5.0 - 8.0 Final  . Glucose, UA 05/23/2016 CANCELED  NEGATIVE Final  . Bilirubin Urine 05/23/2016 CANCELED  NEGATIVE Final  . Ketones, ur 05/23/2016 CANCELED  NEGATIVE Final  . Hgb urine dipstick 05/23/2016 CANCELED  NEGATIVE Final  . Protein, ur 05/23/2016 CANCELED  NEGATIVE Final  . Nitrite 05/23/2016 CANCELED  NEGATIVE Final  . Leukocytes, UA 05/23/2016 CANCELED  NEGATIVE Final  . Cholesterol 05/23/2016 212* <200 mg/dL Final   Comment: ** Please note change in reference range(s). **     . Triglycerides 05/23/2016 86  <150 mg/dL Final   Comment: ** Please note change in reference range(s). **     . HDL 05/23/2016 50* >50 mg/dL Final   Comment: ** Please note change in reference range(s). **     . Total CHOL/HDL Ratio 05/23/2016 4.2  <5.0 Ratio Final  . VLDL 05/23/2016 17  <30 mg/dL Final  . LDL Cholesterol 05/23/2016 145* <100 mg/dL Final   Comment: ** Please note change in reference range(s). **       No results found.   Assessment/Plan   ICD-9-CM ICD-10-CM   1. Attention deficit disorder (ADD) without hyperactivity 314.00 F98.8 amphetamine-dextroamphetamine (ADDERALL) 20 MG tablet  2. Class 3 obesity due to excess calories without serious comorbidity with body mass index (BMI) of 40.0 to  44.9 in adult ([place]) 278.00 E66.09    V85.41 Z68.41    3. Encounter for immunization Z23 Z23 amphetamine-dextroamphetamine (ADDERALL) 20 MG tablet     Flu Vaccine QUAD 36+ mos IM  4. Mixed hyperlipidemia 272.2 E78.2 Lipid Panel     Reduce calories in diet to 1200 per day  Increase exercise as tolerated to 30-45 minutes at least 4 times per week  Continue adderall as ordered  Flu shot given today  Follow up in 4 mos for CPE. Check fasting lipid panel prior to appt    [facility] S. Perlie Gold  [facility] [address] [place], [place] [address] [phone] Cell (Monday-Friday 8 AM - 5 PM) [phone] After 5 PM and follow prompts

## 2016-08-20 NOTE — Patient Instructions (Addendum)
Reduce calories in diet to 1200 per day  Increase exercise as tolerated to 30-45 minutes at least 4 times per week  Continue adderall as ordered  Flu shot given today  Follow up in 4 mos for CPE. Check fasting lipid panel prior to appt

## 2016-09-04 ENCOUNTER — Telehealth: Payer: Self-pay

## 2016-09-04 NOTE — Telephone Encounter (Signed)
I called patient to remind her that she is due for a mammogram. Order was placed in 08/2015.  Left message for patient to call the office with any questions or concerns.

## 2016-10-06 ENCOUNTER — Other Ambulatory Visit: Payer: Self-pay | Admitting: Internal Medicine

## 2016-10-06 DIAGNOSIS — Z23 Encounter for immunization: Secondary | ICD-10-CM

## 2016-10-06 DIAGNOSIS — F988 Other specified behavioral and emotional disorders with onset usually occurring in childhood and adolescence: Secondary | ICD-10-CM

## 2016-10-07 ENCOUNTER — Telehealth: Payer: Self-pay

## 2016-10-07 ENCOUNTER — Telehealth: Payer: Self-pay | Admitting: *Deleted

## 2016-10-07 DIAGNOSIS — F988 Other specified behavioral and emotional disorders with onset usually occurring in childhood and adolescence: Secondary | ICD-10-CM

## 2016-10-07 MED ORDER — AMPHETAMINE-DEXTROAMPHETAMINE 20 MG PO TABS: ORAL_TABLET | ORAL | 0 refills | Status: DC

## 2016-10-07 NOTE — Telephone Encounter (Signed)
A refill request was received via Mychart. Prescription was printed and placed in [place] folder for signing.

## 2016-10-07 NOTE — Telephone Encounter (Signed)
I called to let patient know that she has a prescription ready to be picked up at the office. Rx is for Adderall 20 mg tablet. #120. Take 1-2 tablets by mouth twice daily for ADD.    Rx was placed in filing cabinet at front dest.

## 2016-10-07 NOTE — Telephone Encounter (Signed)
Alexandra Proctor with Alexandra Proctor called and stated that he received a fax Rx for Adderall for patient and he cannot accept it because it is a controlled.  Printed Rx and called patient and left message on her voicemail that she would have to pick up a hard copy Rx and take it to her pharmacy.  Rx printed.

## 2016-11-05 ENCOUNTER — Other Ambulatory Visit: Payer: Self-pay | Admitting: Internal Medicine

## 2016-11-05 DIAGNOSIS — F988 Other specified behavioral and emotional disorders with onset usually occurring in childhood and adolescence: Secondary | ICD-10-CM

## 2016-11-06 ENCOUNTER — Other Ambulatory Visit: Payer: Self-pay

## 2016-11-06 DIAGNOSIS — F988 Other specified behavioral and emotional disorders with onset usually occurring in childhood and adolescence: Secondary | ICD-10-CM

## 2016-11-06 MED ORDER — AMPHETAMINE-DEXTROAMPHETAMINE 20 MG PO TABS: ORAL_TABLET | ORAL | 0 refills | Status: DC

## 2016-11-06 NOTE — Telephone Encounter (Signed)
Patient requested a refill through mychart. Rx was printed and placed in Dr. Cyndi Lennert folder for signing. Patient will be called once Rx is signed and ready for pick up.

## 2016-12-17 ENCOUNTER — Other Ambulatory Visit: Payer: Self-pay | Admitting: Internal Medicine

## 2016-12-17 DIAGNOSIS — F988 Other specified behavioral and emotional disorders with onset usually occurring in childhood and adolescence: Secondary | ICD-10-CM

## 2016-12-18 MED ORDER — AMPHETAMINE-DEXTROAMPHETAMINE 20 MG PO TABS: ORAL_TABLET | ORAL | 0 refills | Status: DC

## 2016-12-18 NOTE — Telephone Encounter (Signed)
  Alexandra Proctor Preferred Name:  None Female, 56 y.o., [birth year] Last Weight:  (!) 303 lb 12.8 oz (137.8 kg) Weight:  (!) 303 lb 12.8 oz (137.8 kg) Phone:  [phone] PCP:  Gildardo Cranker, DO Language:  English Need Interpreter:  None Allergies:  No Known Allergies Health Maintenance Due?:  Health Maintenance Active FYIs:  General Primary Ins.:  BLUE Porfirio Mylar MRN:  [id number] MyChart:  Active Next Appt:  01/13/2017    << Less Detail',event)" href="javascript:;">More Detail >>  FW: Medication Renewal Request  Alexandra Proctor, CMA  Sent: Wed December 17, 2016 10:00 AM  To: May, [name], [place]  Phone [phone]  Alexandra Proctor  MRN: [id number] DOB: Feb 01, 1961  Pt Home: [phone]    Entered: [phone]    Message   Can you please print for patient to pick up? Thanks  ----- Message -----  From: Alexandra Proctor  Sent: 12/17/2016  9:40 AM  To: [facility]  Subject: Medication Renewal Request              Alexandra Proctor would like a refill of the following medications:     amphetamine-dextroamphetamine (ADDERALL) 20 MG tablet [REED, TIFFANY, DO]    Preferred pharmacy: [id number] - [place], [place] - [place] [address] AT [place] OF [address] [place]    Comment:      Pending    Disp Refills Start End  amphetamine-dextroamphetamine (ADDERALL) 20 MG tablet 120 tablet 0 12/17/2016   Sig:  Take 1-2 tablets by mouth twice daily for ADD  DAW:  No  Outstanding Procedures   Open Future Orders    Priority Expected Expires Ordered  Lipid Panel Routine  02/10/2017 08/20/2016  Normal Orders    Priority Ordered  Flu Vaccine QUAD 36+ mos IM Routine 05/11/2015  Ambulatory referral to Dermatology Routine 05/11/2015  CBC with Differential Routine 05/11/2015  CMP Routine 05/11/2015  Lipid Panel Routine 05/11/2015  TSH Routine 05/11/2015  Urinalysis with Reflex Microscopic Routine 05/11/2015        Rx printed for patient to pick up

## 2017-01-13 ENCOUNTER — Other Ambulatory Visit: Payer: BLUE CROSS/BLUE SHIELD

## 2017-01-13 DIAGNOSIS — E782 Mixed hyperlipidemia: Secondary | ICD-10-CM | POA: Diagnosis not present

## 2017-01-13 LAB — LIPID PANEL
CHOL/HDL RATIO: 3.9 ratio (ref <5.0)
CHOLESTEROL: 210 mg/dL — AB (ref <200)
HDL: 54 mg/dL (ref >50)
LDL Cholesterol: 139 mg/dL — ABNORMAL HIGH (ref <100)
Triglycerides: 87 mg/dL (ref <150)
VLDL: 17 mg/dL (ref <30)

## 2017-01-16 ENCOUNTER — Ambulatory Visit (INDEPENDENT_AMBULATORY_CARE_PROVIDER_SITE_OTHER): Payer: BLUE CROSS/BLUE SHIELD | Admitting: Internal Medicine

## 2017-01-16 ENCOUNTER — Encounter: Payer: Self-pay | Admitting: Internal Medicine

## 2017-01-16 VITALS — BP 138/88 | HR 94 | Temp 98.4°F | Ht 70.0 in | Wt 300.0 lb

## 2017-01-16 DIAGNOSIS — E782 Mixed hyperlipidemia: Secondary | ICD-10-CM

## 2017-01-16 DIAGNOSIS — D2362 Other benign neoplasm of skin of left upper limb, including shoulder: Secondary | ICD-10-CM

## 2017-01-16 DIAGNOSIS — Z0000 Encounter for general adult medical examination without abnormal findings: Secondary | ICD-10-CM | POA: Diagnosis not present

## 2017-01-16 DIAGNOSIS — F988 Other specified behavioral and emotional disorders with onset usually occurring in childhood and adolescence: Secondary | ICD-10-CM

## 2017-01-16 DIAGNOSIS — E6609 Other obesity due to excess calories: Secondary | ICD-10-CM | POA: Diagnosis not present

## 2017-01-16 DIAGNOSIS — Z1239 Encounter for other screening for malignant neoplasm of breast: Secondary | ICD-10-CM

## 2017-01-16 DIAGNOSIS — Z6841 Body Mass Index (BMI) 40.0 and over, adult: Secondary | ICD-10-CM | POA: Diagnosis not present

## 2017-01-16 DIAGNOSIS — Z1231 Encounter for screening mammogram for malignant neoplasm of breast: Secondary | ICD-10-CM

## 2017-01-16 DIAGNOSIS — IMO0001 Reserved for inherently not codable concepts without codable children: Secondary | ICD-10-CM

## 2017-01-16 MED ORDER — AMPHETAMINE-DEXTROAMPHETAMINE 20 MG PO TABS: ORAL_TABLET | ORAL | 0 refills | Status: DC

## 2017-01-16 NOTE — Patient Instructions (Addendum)
Will call with referral to dermatology  Continue current medications as ordered  Continue weight loss regimen  Follow up in 6 mos for obesity, ADD.   Keeping You Healthy  Get These Tests  Blood Pressure- Have your blood pressure checked by your healthcare provider at least once a year.  Normal blood pressure is 120/80.  Weight- Have your body mass index (BMI) calculated to screen for obesity.  BMI is a measure of body fat based on height and weight.  You can calculate your own BMI at [employer].it  Cholesterol- Have your cholesterol checked every year.  Diabetes- Have your blood sugar checked every year if you have high blood pressure, high cholesterol, a family history of diabetes or if you are overweight.  Pap Test - Have a pap test every 1 to 5 years if you have been sexually active.  If you are older than 65 and recent pap tests have been normal you may not need additional pap tests.  In addition, if you have had a hysterectomy  for benign disease additional pap tests are not necessary.  Mammogram-Yearly mammograms are essential for early detection of breast cancer  Screening for Colon Cancer- Colonoscopy starting at age 94. Screening may begin sooner depending on your family history and other health conditions.  Follow up colonoscopy as directed by your Gastroenterologist.  Screening for Osteoporosis- Screening begins at age 82 with bone density scanning, sooner if you are at higher risk for developing Osteoporosis.  Get these medicines  Calcium with Vitamin D- Your body requires 1200-1500 mg of Calcium a day and [phone] IU of Vitamin D a day.  You can only absorb 500 mg of Calcium at a time therefore Calcium must be taken in 2 or 3 separate doses throughout the day.  Hormones- Hormone therapy has been associated with increased risk for certain cancers and heart disease.  Talk to your healthcare provider about if you need relief from menopausal symptoms.  Aspirin-  Ask your healthcare provider about taking Aspirin to prevent Heart Disease and Stroke.  Get these Immuniztions  Flu shot- Every fall  Pneumonia shot- Once after the age of 72; if you are younger ask your healthcare provider if you need a pneumonia shot.  Tetanus- Every ten years.  Zostavax- Once after the age of 79 to prevent shingles.  Take these steps  Don't smoke- Your healthcare provider can help you quit. For tips on how to quit, ask your healthcare provider or go to www.smokefree.gov or call 1-800 QUIT-NOW.  Be physically active- Exercise 5 days a week for a minimum of 30 minutes.  If you are not already physically active, start slow and gradually work up to 30 minutes of moderate physical activity.  Try walking, dancing, bike riding, swimming, etc.  Eat a healthy diet- Eat a variety of healthy foods such as fruits, vegetables, whole grains, low fat milk, low fat cheeses, yogurt, lean meats, chicken, fish, eggs, dried beans, tofu, etc.  For more information go to www.thenutritionsource.org  Dental visit- Brush and floss teeth twice daily; visit your dentist twice a year.  Eye exam- Visit your Optometrist or Ophthalmologist yearly.  Drink alcohol in moderation- Limit alcohol intake to one drink or less a day.  Never drink and drive.  Depression- Your emotional health is as important as your physical health.  If you're feeling down or losing interest in things you normally enjoy, please talk to your healthcare provider.  Seat Belts- can save your life; always wear one  Smoke/Carbon Monoxide detectors- These detectors need to be installed on the appropriate level of your home.  Replace batteries at least once a year.  Violence- If anyone is threatening or hurting you, please tell your healthcare provider.  Living Will/ Health care power of attorney- Discuss with your healthcare provider and family.

## 2017-01-16 NOTE — Progress Notes (Signed)
Patient ID: Alexandra Proctor, female   DOB: 17-Mar-1961, 56 y.o.   MRN: [id number]   Location:  [facility]  Place of Service:  OFFICE  Provider: Arletha Grippe, DO  Patient Care Team: Gildardo Cranker, DO as PCP - General (Internal Medicine)  Extended Emergency Contact Information Primary Emergency Contact: McDough,Peg Address: [place] [address] [place] Phone: [phone] Relation: Sister  Code Status: FULL CODE Goals of Care: Advanced Directive information Advanced Directives 01/16/2017  Does Patient Have a Medical Advance Directive? No  Would patient like information on creating a medical advance directive? No - Patient declined     Chief Complaint  Patient presents with  . Annual Exam    CPE, check mole on left arm    HPI: Patient is a 56 y.o. female seen in today for an annual wellness exam.    She has a mole on left arm x few mos that has changed color and appears to have pink hue around it. No easy friability. No pain. No swollen glands.   ADD - She needs RF on adderall. Adult ADHD self report scale completed. She does not see Psych but has previously had a thorough w/u.  Obesity - weight down 3 lbs. BMI 43.1  Depression screen [facility] 2/9 01/16/2017 05/11/2015  Decreased Interest 0 0  Down, Depressed, Hopeless 0 0  PHQ - 2 Score 0 0    Fall Risk  01/16/2017 08/20/2016 11/21/2015 05/11/2015 09/26/2014  Falls in the past year? No No No No No   No flowsheet data found.   Health Maintenance  Topic Date Due  . Hepatitis C Screening  Oct 23, 1960  . HIV Screening  12/12/1976  . MAMMOGRAM  12/13/2011  . COLONOSCOPY  12/13/2011  . INFLUENZA VACCINE  02/11/2017  . PAP SMEAR  05/10/2018  . TETANUS/TDAP  07/14/2018    Past Medical History:  Diagnosis Date  . ADD (attention deficit disorder) 05/02/2014  . Mixed hyperlipidemia 08/20/2016  . Obesity 05/02/2014    Past Surgical History:  Procedure Laterality Date  . GASTRIC BYPASS  2002      Family History  Problem Relation Age of Onset  . Cancer Father   . Breast cancer Mother   . Stroke Mother   . Breast cancer Maternal Grandmother   . Breast cancer Paternal Grandmother   . Asthma Brother    Family Status  Relation Status  . Father Deceased  . Mother Alive  . Sister Alive  . Brother Alive  . Brother Alive  . Daughter Alive  . Daughter Alive  . MGM Deceased at age 80  . PGM Deceased at age 37  . Brother (Not Specified)    Social History   Social History  . Marital status: Divorced    Spouse name: N/A  . Number of children: N/A  . Years of education: N/A   Occupational History  . Not on file.   Social History Main Topics  . Smoking status: Never Smoker  . Smokeless tobacco: Never Used  . Alcohol use 0.0 oz/week     Comment: 2-4 drinks weekly   . Drug use: Unknown  . Sexual activity: Not on file   Other Topics Concern  . Not on file   Social History Narrative   Yes patient drinks/eats things containing caffeine    Patient lives in an apartment- one or more stories (1 person), no pets    Patient  exercises (walking 3-5 times weekly)                        No Known Allergies  Allergies as of 01/16/2017   No Known Allergies     Medication List       Accurate as of 01/16/17 10:39 AM. Always use your most recent med list.          amphetamine-dextroamphetamine 20 MG tablet Commonly known as:  ADDERALL Take 1-2 tablets by mouth twice daily for ADD   ibuprofen 200 MG tablet Commonly known as:  ADVIL,MOTRIN 200 mg. 1 by mouth once weekly as needed   multivitamin tablet Take 1 tablet by mouth daily.   polyethylene glycol powder powder Commonly known as:  GLYCOLAX/MIRALAX Take 17 g by mouth daily as needed for mild constipation. Sample provided        Review of Systems:  Review of Systems  Skin: Positive for color change (on mole).  All other systems reviewed and are negative.   Physical Exam: Vitals:   01/16/17 1010   BP: 138/88  Pulse: 94  Temp: 98.4 F (36.9 C)  TempSrc: Oral  SpO2: 96%  Weight: 300 lb (136.1 kg)  Height: 5\' 10"  (1.778 m)   Body mass index is 43.05 kg/m. Physical Exam  Constitutional: She is oriented to person, place, and time. She appears well-developed and well-nourished. No distress.  HENT:  Head: Normocephalic and atraumatic.  Right Ear: External ear normal.  Left Ear: External ear normal.  Mouth/Throat: Oropharynx is clear and moist. No oropharyngeal exudate.  MMM; no oral thrush  Eyes: EOM are normal. Pupils are equal, round, and reactive to light. No scleral icterus.  Neck: Normal range of motion. Neck supple. Carotid bruit is not present. No tracheal deviation present. No thyromegaly present.  Cardiovascular: Normal rate, regular rhythm and intact distal pulses.  Exam reveals no gallop and no friction rub.   No murmur heard. No LE edema b/l. No calf TTP  Pulmonary/Chest: Effort normal and breath sounds normal. No respiratory distress. She has no wheezes. She has no rhonchi. She has no rales. She exhibits no tenderness. Right breast exhibits no inverted nipple, no mass, no nipple discharge, no skin change and no tenderness. Left breast exhibits no inverted nipple, no mass, no nipple discharge, no skin change and no tenderness. Breasts are symmetrical.  No rhonchi  Abdominal: Soft. Bowel sounds are normal. She exhibits no distension and no mass. There is no hepatosplenomegaly or hepatomegaly. There is no tenderness. There is no rebound and no guarding. No hernia.  Musculoskeletal: She exhibits edema. She exhibits no deformity.  Lymphadenopathy:    She has no cervical adenopathy.  Neurological: She is alert and oriented to person, place, and time. She has normal reflexes.  Skin: Skin is warm and dry. No rash noted.     Psychiatric: She has a normal mood and affect. Her behavior is normal. Judgment normal.  Vitals reviewed.   Labs reviewed:  Basic Metabolic  Panel:  Recent Labs  05/23/16 0805  NA 140  K 3.8  CL 107  CO2 23  GLUCOSE 102*  BUN 13  CREATININE 0.53  CALCIUM 8.1*  TSH 3.06   Liver Function Tests:  Recent Labs  05/23/16 0805  AST 15  ALT 18  ALKPHOS 75  BILITOT 0.4  PROT 6.5  ALBUMIN 3.8   No results for input(s): LIPASE, AMYLASE in the last 8760 hours. No results for input(s): AMMONIA  in the last 8760 hours. CBC:  Recent Labs  05/23/16 0805  WBC 6.7  NEUTROABS 3,685  HGB 14.2  HCT 41.7  MCV 88.5  PLT 238   Lipid Panel:  Recent Labs  05/23/16 0805 01/13/17 0841  CHOL 212* 210*  HDL 50* 54  LDLCALC 145* 139*  TRIG 86 87  CHOLHDL 4.2 3.9   Lab Results  Component Value Date   HGBA1C 5.4 05/02/2014    Procedures: No results found. ECG OBTAINED AND REVIEWED BY MYSELF:  NSR @ 78 bpm, nml axis, poor R wave progression. No acute ischemic changes, no other ECG available to compare  Assessment/Plan   ICD-10-CM   1. Well adult exam Z00.00 EKG 12-Lead  2. Attention deficit disorder (ADD) without hyperactivity F98.8 amphetamine-dextroamphetamine (ADDERALL) 20 MG tablet  3. Dysplastic nevus of left upper extremity D23.62 Ambulatory referral to Dermatology  4. Mixed hyperlipidemia E78.2   5. Class 3 obesity due to excess calories without serious comorbidity with body mass index (BMI) of 40.0 to 44.9 in adult ([place]) E66.09    Z68.41   6. Breast cancer screening Z12.31 MM Digital Screening   cologuard for colon cancer screening - she declined colonoscopy   Will call with referral to dermatology  Continue current medications as ordered  Continue weight loss regimen  Follow up in 6 mos for obesity, ADD.   Keeping You Healthy handout given   Cordella Register. Perlie Gold  [facility] [address] [place], [place] [address] [phone] Cell (Monday-Friday 8 AM - 5 PM) [phone] After 5 PM and follow prompts

## 2017-03-03 ENCOUNTER — Other Ambulatory Visit: Payer: Self-pay | Admitting: Internal Medicine

## 2017-03-03 DIAGNOSIS — F988 Other specified behavioral and emotional disorders with onset usually occurring in childhood and adolescence: Secondary | ICD-10-CM

## 2017-03-03 MED ORDER — AMPHETAMINE-DEXTROAMPHETAMINE 20 MG PO TABS: ORAL_TABLET | ORAL | 0 refills | Status: DC

## 2017-03-03 NOTE — Telephone Encounter (Signed)
Script for Adderall was printed for patient to pick up here in the office, she was called promptly for this medication.

## 2017-04-15 ENCOUNTER — Other Ambulatory Visit: Payer: Self-pay | Admitting: Internal Medicine

## 2017-04-15 DIAGNOSIS — F988 Other specified behavioral and emotional disorders with onset usually occurring in childhood and adolescence: Secondary | ICD-10-CM

## 2017-04-15 MED ORDER — AMPHETAMINE-DEXTROAMPHETAMINE 20 MG PO TABS: ORAL_TABLET | ORAL | 0 refills | Status: DC

## 2017-04-15 NOTE — Telephone Encounter (Signed)
Rx request was sent via mychart. Rx printed and placed in [place] sign folder.

## 2017-05-20 ENCOUNTER — Other Ambulatory Visit: Payer: Self-pay | Admitting: Nurse Practitioner

## 2017-05-20 DIAGNOSIS — F988 Other specified behavioral and emotional disorders with onset usually occurring in childhood and adolescence: Secondary | ICD-10-CM

## 2017-05-20 MED ORDER — AMPHETAMINE-DEXTROAMPHETAMINE 20 MG PO TABS: ORAL_TABLET | ORAL | 0 refills | Status: DC

## 2017-05-20 NOTE — Telephone Encounter (Signed)
A mychart message was received from patient asking that refill be sent to pharmacy. Rx was printed and placed in Dr [place] folder for signing. Patient will be called once Rx is signed.

## 2017-05-21 ENCOUNTER — Ambulatory Visit (INDEPENDENT_AMBULATORY_CARE_PROVIDER_SITE_OTHER): Payer: BLUE CROSS/BLUE SHIELD | Admitting: *Deleted

## 2017-05-21 DIAGNOSIS — Z23 Encounter for immunization: Secondary | ICD-10-CM

## 2017-06-25 ENCOUNTER — Other Ambulatory Visit: Payer: Self-pay | Admitting: Internal Medicine

## 2017-06-25 DIAGNOSIS — F988 Other specified behavioral and emotional disorders with onset usually occurring in childhood and adolescence: Secondary | ICD-10-CM

## 2017-06-25 MED ORDER — AMPHETAMINE-DEXTROAMPHETAMINE 20 MG PO TABS: ORAL_TABLET | ORAL | 0 refills | Status: DC

## 2017-06-25 NOTE — Telephone Encounter (Signed)
A request was received from pharmacy for a refill of adderall 20 mg tablets. Rx was pended to provider after verifying last fill date, provider, and quantity on PMP [place].

## 2017-07-29 ENCOUNTER — Other Ambulatory Visit: Payer: Self-pay | Admitting: Internal Medicine

## 2017-07-29 DIAGNOSIS — F988 Other specified behavioral and emotional disorders with onset usually occurring in childhood and adolescence: Secondary | ICD-10-CM

## 2017-07-29 MED ORDER — AMPHETAMINE-DEXTROAMPHETAMINE 20 MG PO TABS
ORAL_TABLET | ORAL | 0 refills | Status: DC
Start: 2017-07-29 — End: 2017-09-10

## 2017-07-29 NOTE — Telephone Encounter (Signed)
A refill request was received for adderall 20 mg tablets. Rx was pended to provider for refill after verifying last fill date, provider, and quantity on PMP [place].

## 2017-09-10 ENCOUNTER — Other Ambulatory Visit: Payer: Self-pay | Admitting: Internal Medicine

## 2017-09-10 DIAGNOSIS — F988 Other specified behavioral and emotional disorders with onset usually occurring in childhood and adolescence: Secondary | ICD-10-CM

## 2017-09-11 MED ORDER — AMPHETAMINE-DEXTROAMPHETAMINE 20 MG PO TABS: ORAL_TABLET | ORAL | 0 refills | Status: DC

## 2017-09-11 NOTE — Telephone Encounter (Signed)
[place] Database verified and compliance confirmed   

## 2017-10-19 ENCOUNTER — Other Ambulatory Visit: Payer: Self-pay | Admitting: Internal Medicine

## 2017-10-19 DIAGNOSIS — F988 Other specified behavioral and emotional disorders with onset usually occurring in childhood and adolescence: Secondary | ICD-10-CM

## 2017-10-19 MED ORDER — AMPHETAMINE-DEXTROAMPHETAMINE 20 MG PO TABS: ORAL_TABLET | ORAL | 0 refills | Status: DC

## 2017-10-19 NOTE — Telephone Encounter (Signed)
A medication refill was received from pharmacy for adderall 20 mg. Rx was pended to provider for approval  after verifying last fill date, provider, and quantity on PMP [place] database 

## 2017-12-08 ENCOUNTER — Other Ambulatory Visit: Payer: Self-pay | Admitting: Internal Medicine

## 2017-12-08 DIAGNOSIS — F988 Other specified behavioral and emotional disorders with onset usually occurring in childhood and adolescence: Secondary | ICD-10-CM

## 2017-12-08 MED ORDER — AMPHETAMINE-DEXTROAMPHETAMINE 20 MG PO TABS: ORAL_TABLET | ORAL | 0 refills | Status: DC

## 2017-12-08 NOTE — Telephone Encounter (Signed)
A medication refill was received from pharmacy for adderall 20 mg. Rx was pended to provider for approval  after verifying last fill date, provider, and quantity on PMP [place] database 

## 2018-01-13 ENCOUNTER — Other Ambulatory Visit: Payer: Self-pay | Admitting: Internal Medicine

## 2018-01-13 DIAGNOSIS — F988 Other specified behavioral and emotional disorders with onset usually occurring in childhood and adolescence: Secondary | ICD-10-CM

## 2018-01-13 MED ORDER — AMPHETAMINE-DEXTROAMPHETAMINE 20 MG PO TABS: ORAL_TABLET | ORAL | 0 refills | Status: DC

## 2018-01-13 NOTE — Telephone Encounter (Signed)
A medication refill was received from pharmacy for adderall 20 mg. Rx was pended to provider for approval  after verifying last fill date, provider, and quantity on PMP [place]

## 2018-02-24 ENCOUNTER — Other Ambulatory Visit: Payer: Self-pay | Admitting: Internal Medicine

## 2018-02-24 DIAGNOSIS — F988 Other specified behavioral and emotional disorders with onset usually occurring in childhood and adolescence: Secondary | ICD-10-CM

## 2018-02-25 MED ORDER — AMPHETAMINE-DEXTROAMPHETAMINE 20 MG PO TABS: ORAL_TABLET | ORAL | 0 refills | Status: DC

## 2018-02-25 NOTE — Telephone Encounter (Signed)
A medication refill was received from pharmacy for adderall 20 mg. Rx was pended to provider for approval  after verifying last fill date, provider, and quantity on PMP [place]

## 2018-03-03 ENCOUNTER — Encounter: Payer: Self-pay | Admitting: Internal Medicine

## 2018-04-06 ENCOUNTER — Other Ambulatory Visit: Payer: Self-pay | Admitting: Internal Medicine

## 2018-04-06 DIAGNOSIS — F988 Other specified behavioral and emotional disorders with onset usually occurring in childhood and adolescence: Secondary | ICD-10-CM

## 2018-04-06 MED ORDER — AMPHETAMINE-DEXTROAMPHETAMINE 20 MG PO TABS: ORAL_TABLET | ORAL | 0 refills | Status: DC

## 2018-04-06 NOTE — Telephone Encounter (Signed)
A medication refill was received from pharmacy for adderall 20 mg . Rx was pended to provider for approval  after verifying last fill date, provider, and quantity on PMP [place]

## 2018-05-31 ENCOUNTER — Other Ambulatory Visit: Payer: Self-pay

## 2018-05-31 DIAGNOSIS — F988 Other specified behavioral and emotional disorders with onset usually occurring in childhood and adolescence: Secondary | ICD-10-CM

## 2018-05-31 MED ORDER — AMPHETAMINE-DEXTROAMPHETAMINE 20 MG PO TABS: ORAL_TABLET | ORAL | 0 refills | Status: DC

## 2018-05-31 NOTE — Telephone Encounter (Signed)
Message left on clinical intake voicemail:   Patient is requesting refill on Adderall  Last OV 01/2017, no pending OV, RX last filled 04/06/18  [place] Database verified and compliance confirmed

## 2018-05-31 NOTE — Telephone Encounter (Signed)
I called patient to schedule an appointment. Patient schedule appointment for next Wednesday 06/09/18   S.Chrae B/[facility]

## 2018-06-09 ENCOUNTER — Ambulatory Visit (INDEPENDENT_AMBULATORY_CARE_PROVIDER_SITE_OTHER): Payer: BLUE CROSS/BLUE SHIELD | Admitting: Nurse Practitioner

## 2018-06-09 ENCOUNTER — Encounter: Payer: Self-pay | Admitting: Nurse Practitioner

## 2018-06-09 VITALS — BP 128/84 | HR 86 | Temp 98.1°F | Ht 70.0 in | Wt 296.0 lb

## 2018-06-09 DIAGNOSIS — Z1231 Encounter for screening mammogram for malignant neoplasm of breast: Secondary | ICD-10-CM

## 2018-06-09 DIAGNOSIS — R87619 Unspecified abnormal cytological findings in specimens from cervix uteri: Secondary | ICD-10-CM

## 2018-06-09 DIAGNOSIS — F988 Other specified behavioral and emotional disorders with onset usually occurring in childhood and adolescence: Secondary | ICD-10-CM

## 2018-06-09 DIAGNOSIS — Z0000 Encounter for general adult medical examination without abnormal findings: Secondary | ICD-10-CM

## 2018-06-09 DIAGNOSIS — Z6841 Body Mass Index (BMI) 40.0 and over, adult: Secondary | ICD-10-CM

## 2018-06-09 DIAGNOSIS — E782 Mixed hyperlipidemia: Secondary | ICD-10-CM

## 2018-06-09 DIAGNOSIS — Z23 Encounter for immunization: Secondary | ICD-10-CM | POA: Diagnosis not present

## 2018-06-09 NOTE — Patient Instructions (Addendum)
To make appt for next week for FASTING BLOOD WORK    Make sure to follow up with GYN- we have placed a referral    Health Maintenance, Female Adopting a healthy lifestyle and getting preventive care can go a long way to promote health and wellness. Talk with your health care provider about what schedule of regular examinations is right for you. This is a good chance for you to check in with your provider about disease prevention and staying healthy. In between checkups, there are plenty of things you can do on your own. Experts have done a lot of research about which lifestyle changes and preventive measures are most likely to keep you healthy. Ask your health care provider for more information. Weight and diet Eat a healthy diet  Be sure to include plenty of vegetables, fruits, low-fat dairy products, and lean protein.  Do not eat a lot of foods high in solid fats, added sugars, or salt.  Get regular exercise. This is one of the most important things you can do for your health. ? Most adults should exercise for at least 150 minutes each week. The exercise should increase your heart rate and make you sweat (moderate-intensity exercise). ? Most adults should also do strengthening exercises at least twice a week. This is in addition to the moderate-intensity exercise.  Maintain a healthy weight  Body mass index (BMI) is a measurement that can be used to identify possible weight problems. It estimates body fat based on height and weight. Your health care provider can help determine your BMI and help you achieve or maintain a healthy weight.  For females 48 years of age and older: ? A BMI below 18.5 is considered underweight. ? A BMI of 18.5 to 24.9 is normal. ? A BMI of 25 to 29.9 is considered overweight. ? A BMI of 30 and above is considered obese.  Watch levels of cholesterol and blood lipids  You should start having your blood tested for lipids and cholesterol at 56 years of age,  then have this test every 5 years.  You may need to have your cholesterol levels checked more often if: ? Your lipid or cholesterol levels are high. ? You are older than 56 years of age. ? You are at high risk for heart disease.  Cancer screening Lung Cancer  Lung cancer screening is recommended for adults 21-18 years old who are at high risk for lung cancer because of a history of smoking.  A yearly low-dose CT scan of the lungs is recommended for people who: ? Currently smoke. ? Have quit within the past 15 years. ? Have at least a 30-pack-year history of smoking. A pack year is smoking an average of one pack of cigarettes a day for 1 year.  Yearly screening should continue until it has been 15 years since you quit.  Yearly screening should stop if you develop a health problem that would prevent you from having lung cancer treatment.  Breast Cancer  Practice breast self-awareness. This means understanding how your breasts normally appear and feel.  It also means doing regular breast self-exams. Let your health care provider know about any changes, no matter how small.  If you are in your 20s or 30s, you should have a clinical breast exam (CBE) by a health care provider every 1-3 years as part of a regular health exam.  If you are 63 or older, have a CBE every year. Also consider having a breast X-ray (mammogram)  every year.  If you have a family history of breast cancer, talk to your health care provider about genetic screening.  If you are at high risk for breast cancer, talk to your health care provider about having an MRI and a mammogram every year.  Breast cancer gene (BRCA) assessment is recommended for women who have family members with BRCA-related cancers. BRCA-related cancers include: ? Breast. ? Ovarian. ? Tubal. ? Peritoneal cancers.  Results of the assessment will determine the need for genetic counseling and BRCA1 and BRCA2 testing.  Cervical Cancer Your  health care provider may recommend that you be screened regularly for cancer of the pelvic organs (ovaries, uterus, and vagina). This screening involves a pelvic examination, including checking for microscopic changes to the surface of your cervix (Pap test). You may be encouraged to have this screening done every 3 years, beginning at age 59.  For women ages 45-65, health care providers may recommend pelvic exams and Pap testing every 3 years, or they may recommend the Pap and pelvic exam, combined with testing for human papilloma virus (HPV), every 5 years. Some types of HPV increase your risk of cervical cancer. Testing for HPV may also be done on women of any age with unclear Pap test results.  Other health care providers may not recommend any screening for nonpregnant women who are considered low risk for pelvic cancer and who do not have symptoms. Ask your health care provider if a screening pelvic exam is right for you.  If you have had past treatment for cervical cancer or a condition that could lead to cancer, you need Pap tests and screening for cancer for at least 20 years after your treatment. If Pap tests have been discontinued, your risk factors (such as having a new sexual partner) need to be reassessed to determine if screening should resume. Some women have medical problems that increase the chance of getting cervical cancer. In these cases, your health care provider may recommend more frequent screening and Pap tests.  Colorectal Cancer  This type of cancer can be detected and often prevented.  Routine colorectal cancer screening usually begins at 56 years of age and continues through 56 years of age.  Your health care provider may recommend screening at an earlier age if you have risk factors for colon cancer.  Your health care provider may also recommend using home test kits to check for hidden blood in the stool.  A small camera at the end of a tube can be used to examine your  colon directly (sigmoidoscopy or colonoscopy). This is done to check for the earliest forms of colorectal cancer.  Routine screening usually begins at age 66.  Direct examination of the colon should be repeated every 5-10 years through 56 years of age. However, you may need to be screened more often if early forms of precancerous polyps or small growths are found.  Skin Cancer  Check your skin from head to toe regularly.  Tell your health care provider about any new moles or changes in moles, especially if there is a change in a mole's shape or color.  Also tell your health care provider if you have a mole that is larger than the size of a pencil eraser.  Always use sunscreen. Apply sunscreen liberally and repeatedly throughout the day.  Protect yourself by wearing long sleeves, pants, a wide-brimmed hat, and sunglasses whenever you are outside.  Heart disease, diabetes, and high blood pressure  High blood pressure causes  heart disease and increases the risk of stroke. High blood pressure is more likely to develop in: ? People who have blood pressure in the high end of the normal range (130-139/85-89 mm Hg). ? People who are overweight or obese. ? People who are African American.  If you are 79-64 years of age, have your blood pressure checked every 3-5 years. If you are 70 years of age or older, have your blood pressure checked every year. You should have your blood pressure measured twice-once when you are at a hospital or clinic, and once when you are not at a hospital or clinic. Record the average of the two measurements. To check your blood pressure when you are not at a hospital or clinic, you can use: ? An automated blood pressure machine at a pharmacy. ? A home blood pressure monitor.  If you are between 52 years and 40 years old, ask your health care provider if you should take aspirin to prevent strokes.  Have regular diabetes screenings. This involves taking a blood sample  to check your fasting blood sugar level. ? If you are at a normal weight and have a low risk for diabetes, have this test once every three years after 56 years of age. ? If you are overweight and have a high risk for diabetes, consider being tested at a younger age or more often. Preventing infection Hepatitis B  If you have a higher risk for hepatitis B, you should be screened for this virus. You are considered at high risk for hepatitis B if: ? You were born in a country where hepatitis B is common. Ask your health care provider which countries are considered high risk. ? Your parents were born in a high-risk country, and you have not been immunized against hepatitis B (hepatitis B vaccine). ? You have HIV or AIDS. ? You use needles to inject street drugs. ? You live with someone who has hepatitis B. ? You have had sex with someone who has hepatitis B. ? You get hemodialysis treatment. ? You take certain medicines for conditions, including cancer, organ transplantation, and autoimmune conditions.  Hepatitis C  Blood testing is recommended for: ? Everyone born from [birth year] through 1964. ? Anyone with known risk factors for hepatitis C.  Sexually transmitted infections (STIs)  You should be screened for sexually transmitted infections (STIs) including gonorrhea and chlamydia if: ? You are sexually active and are younger than 57 years of age. ? You are older than 56 years of age and your health care provider tells you that you are at risk for this type of infection. ? Your sexual activity has changed since you were last screened and you are at an increased risk for chlamydia or gonorrhea. Ask your health care provider if you are at risk.  If you do not have HIV, but are at risk, it may be recommended that you take a prescription medicine daily to prevent HIV infection. This is called pre-exposure prophylaxis (PrEP). You are considered at risk if: ? You are sexually active and do not  regularly use condoms or know the HIV status of your partner(s). ? You take drugs by injection. ? You are sexually active with a partner who has HIV.  Talk with your health care provider about whether you are at high risk of being infected with HIV. If you choose to begin PrEP, you should first be tested for HIV. You should then be tested every 3 months for as long as  you are taking PrEP. Pregnancy  If you are premenopausal and you may become pregnant, ask your health care provider about preconception counseling.  If you may become pregnant, take 400 to 800 micrograms (mcg) of folic acid every day.  If you want to prevent pregnancy, talk to your health care provider about birth control (contraception). Osteoporosis and menopause  Osteoporosis is a disease in which the bones lose minerals and strength with aging. This can result in serious bone fractures. Your risk for osteoporosis can be identified using a bone density scan.  If you are 82 years of age or older, or if you are at risk for osteoporosis and fractures, ask your health care provider if you should be screened.  Ask your health care provider whether you should take a calcium or vitamin D supplement to lower your risk for osteoporosis.  Menopause may have certain physical symptoms and risks.  Hormone replacement therapy may reduce some of these symptoms and risks. Talk to your health care provider about whether hormone replacement therapy is right for you. Follow these instructions at home:  Schedule regular health, dental, and eye exams.  Stay current with your immunizations.  Do not use any tobacco products including cigarettes, chewing tobacco, or electronic cigarettes.  If you are pregnant, do not drink alcohol.  If you are breastfeeding, limit how much and how often you drink alcohol.  Limit alcohol intake to no more than 1 drink per day for nonpregnant women. One drink equals 12 ounces of beer, 5 ounces of wine, or  1 ounces of hard liquor.  Do not use street drugs.  Do not share needles.  Ask your health care provider for help if you need support or information about quitting drugs.  Tell your health care provider if you often feel depressed.  Tell your health care provider if you have ever been abused or do not feel safe at home. This information is not intended to replace advice given to you by your health care provider. Make sure you discuss any questions you have with your health care provider. Document Released: 01/13/2011 Document Revised: 12/06/2015 Document Reviewed: 04/03/2015 Elsevier Interactive Patient Education  2018 [employer]. Fat and Cholesterol Restricted Diet Getting too much fat and cholesterol in your diet may cause health problems. Following this diet helps keep your fat and cholesterol at normal levels. This can keep you from getting sick. What types of fat should I choose?  Choose monosaturated and polyunsaturated fats. These are found in foods such as olive oil, canola oil, flaxseeds, walnuts, almonds, and seeds.  Eat more omega-3 fats. Good choices include salmon, mackerel, sardines, tuna, flaxseed oil, and ground flaxseeds.  Limit saturated fats. These are in animal products such as meats, butter, and cream. They can also be in plant products such as palm oil, palm kernel oil, and coconut oil.  Avoid foods with partially hydrogenated oils in them. These contain trans fats. Examples of foods that have trans fats are stick margarine, some tub margarines, cookies, crackers, and other baked goods. What general guidelines do I need to follow?  Check food labels. Look for the words "trans fat" and "saturated fat."  When preparing a meal: ? Fill half of your plate with vegetables and green salads. ? Fill one fourth of your plate with whole grains. Look for the word "whole" as the first word in the ingredient list. ? Fill one fourth of your plate with lean protein  foods.  Eat more foods that have  fiber, like apples, carrots, beans, peas, and barley.  Eat more home-cooked foods. Eat less at restaurants and buffets.  Limit or avoid alcohol.  Limit foods high in starch and sugar.  Limit fried foods.  Cook foods without frying them. Baking, boiling, grilling, and broiling are all great options.  Lose weight if you are overweight. Losing even a small amount of weight can help your overall health. It can also help prevent diseases such as diabetes and heart disease. What foods can I eat? Grains Whole grains, such as whole wheat or whole grain breads, crackers, cereals, and pasta. Unsweetened oatmeal, bulgur, barley, quinoa, or brown rice. Corn or whole wheat flour tortillas. Vegetables Fresh or frozen vegetables (raw, steamed, roasted, or grilled). Green salads. Fruits All fresh, canned (in natural juice), or frozen fruits. Meat and Other Protein Products Ground beef (85% or leaner), grass-fed beef, or beef trimmed of fat. Skinless chicken or [place]. Ground chicken or [place]. Pork trimmed of fat. All fish and seafood. Eggs. Dried beans, peas, or lentils. Unsalted nuts or seeds. Unsalted canned or dry beans. Dairy Low-fat dairy products, such as skim or 1% milk, 2% or reduced-fat cheeses, low-fat ricotta or cottage cheese, or plain low-fat yogurt. Fats and Oils Tub margarines without trans fats. Light or reduced-fat mayonnaise and salad dressings. Avocado. Olive, canola, sesame, or safflower oils. Natural peanut or almond butter (choose ones without added sugar and oil). The items listed above may not be a complete list of recommended foods or beverages. Contact your dietitian for more options. What foods are not recommended? Grains White bread. White pasta. White rice. Cornbread. Bagels, pastries, and croissants. Crackers that contain trans fat. Vegetables White potatoes. Corn. Creamed or fried vegetables. Vegetables in a cheese  sauce. Fruits Dried fruits. Canned fruit in light or heavy syrup. Fruit juice. Meat and Other Protein Products Fatty cuts of meat. Ribs, chicken wings, bacon, sausage, bologna, salami, chitterlings, fatback, hot dogs, bratwurst, and packaged luncheon meats. Liver and organ meats. Dairy Whole or 2% milk, cream, half-and-half, and cream cheese. Whole milk cheeses. Whole-fat or sweetened yogurt. Full-fat cheeses. Nondairy creamers and whipped toppings. Processed cheese, cheese spreads, or cheese curds. Sweets and Desserts Corn syrup, sugars, honey, and molasses. Candy. Jam and jelly. Syrup. Sweetened cereals. Cookies, pies, cakes, donuts, muffins, and ice cream. Fats and Oils Butter, stick margarine, lard, shortening, ghee, or bacon fat. Coconut, palm kernel, or palm oils. Beverages Alcohol. Sweetened drinks (such as sodas, lemonade, and fruit drinks or punches). The items listed above may not be a complete list of foods and beverages to avoid. Contact your dietitian for more information. This information is not intended to replace advice given to you by your health care provider. Make sure you discuss any questions you have with your health care provider. Document Released: 12/30/2011 Document Revised: 03/06/2016 Document Reviewed: 09/29/2013 Elsevier Interactive Patient Education  [employer].

## 2018-06-09 NOTE — Progress Notes (Signed)
Provider: Lauree Chandler, NP  Patient Care Team: Lauree Chandler, NP as PCP - General (Geriatric Medicine)  Extended Emergency Contact Information Primary Emergency Contact: McDough,Peg Address: [place] [address] Johnnette Litter of [place] Phone: [phone] Relation: Sister No Known Allergies Code Status: FULL Goals of Care: Advanced Directive information Advanced Directives 06/09/2018  Does Patient Have a Medical Advance Directive? No  Would patient like information on creating a medical advance directive? -     Chief Complaint  Patient presents with  . Annual Exam    Pt is being seen for a physical. Pt does not want pap today.   . Immunizations    Pt wants flu vaccine.     HPI: Patient is a 57 y.o. female seen in today for an annual wellness exam.    ADD -  She does not see Psych but has previously had a thorough w/u.   She had abnormal PAP in 2016, never followed up on exam, has not seen GYN but would rather go since last pap was abnormal.  Over due for mammogram as well.   Does not wish to get colonscopy but willing to do cologuard- no colon cancer in family and has twin sister who has had normal exams   Takes extra steps but no routine exercise.    Depression screen [facility] 2/9 06/09/2018 01/16/2017 05/11/2015  Decreased Interest 0 0 0  Down, Depressed, Hopeless 0 0 0  PHQ - 2 Score 0 0 0    Fall Risk  06/09/2018 01/16/2017 08/20/2016 11/21/2015 05/11/2015  Falls in the past year? 0 No No No No   No flowsheet data found.   Health Maintenance  Topic Date Due  . MAMMOGRAM  12/13/2011  . COLONOSCOPY  12/13/2011  . INFLUENZA VACCINE  02/11/2018  . PAP SMEAR  05/10/2018  . HIV Screening  06/10/2019 (Originally 12/12/1975)  . Hepatitis C Screening  06/13/2019 (Originally 1960-08-31)  . TETANUS/TDAP  07/14/2018    Diet? Tries to limit sugar and eat vegetables.  Does not eat fast foods, likes to cook  Dentition:going to  dentist routinely  Ophthalmology: overdue for yearly exam.   Pain:none  Past Medical History:  Diagnosis Date  . ADD (attention deficit disorder) 05/02/2014  . Mixed hyperlipidemia 08/20/2016  . Obesity 05/02/2014    Past Surgical History:  Procedure Laterality Date  . GASTRIC BYPASS  2002    Social History   Socioeconomic History  . Marital status: Divorced    Spouse name: Not on file  . Number of children: Not on file  . Years of education: Not on file  . Highest education level: Not on file  Occupational History  . Not on file  Social Needs  . Financial resource strain: Not on file  . Food insecurity:    Worry: Not on file    Inability: Not on file  . Transportation needs:    Medical: Not on file    Non-medical: Not on file  Tobacco Use  . Smoking status: Never Smoker  . Smokeless tobacco: Never Used  Substance and Sexual Activity  . Alcohol use: Yes    Alcohol/week: 0.0 standard drinks    Comment: 2-4 drinks weekly   . Drug use: Not on file  . Sexual activity: Not on file  Lifestyle  . Physical activity:    Days per week: Not on file    Minutes per session: Not on  file  . Stress: Not on file  Relationships  . Social connections:    Talks on phone: Not on file    Gets together: Not on file    Attends religious service: Not on file    Active member of club or organization: Not on file    Attends meetings of clubs or organizations: Not on file    Relationship status: Not on file  Other Topics Concern  . Not on file  Social History Narrative   Yes patient drinks/eats things containing caffeine    Patient lives in an apartment- one or more stories (1 person), no pets    Patient exercises (walking 3-5 times weekly)                        Family History  Problem Relation Age of Onset  . Cancer Father   . Breast cancer Mother   . Stroke Mother   . Breast cancer Maternal Grandmother   . Breast cancer Paternal Grandmother   . Asthma Brother      Review of Systems:  Review of Systems  Constitutional: Negative for activity change, appetite change, chills, diaphoresis, fatigue and fever.  HENT: Negative for ear pain and sore throat.   Eyes: Negative for visual disturbance.  Respiratory: Negative for cough, chest tightness and shortness of breath.   Cardiovascular: Negative for chest pain, palpitations and leg swelling.  Gastrointestinal: Negative for abdominal pain, blood in stool, constipation, diarrhea, nausea and vomiting.  Genitourinary: Negative for dysuria.  Musculoskeletal: Negative for arthralgias.  Neurological: Negative for dizziness, tremors, numbness and headaches.  Psychiatric/Behavioral: Negative for sleep disturbance. The patient is not nervous/anxious.      Allergies as of 06/09/2018   No Known Allergies     Medication List        Accurate as of 06/09/18 11:03 AM. Always use your most recent med list.          amphetamine-dextroamphetamine 20 MG tablet Commonly known as:  ADDERALL Take 1-2 tablets by mouth twice daily for ADD   ibuprofen 200 MG tablet Commonly known as:  ADVIL,MOTRIN 200 mg. 1 by mouth once weekly as needed   multivitamin tablet Take 1 tablet by mouth daily.   polyethylene glycol powder powder Commonly known as:  GLYCOLAX/MIRALAX Take 17 g by mouth daily as needed for mild constipation. Sample provided         Physical Exam: Vitals:   06/09/18 1043  BP: 128/84  Pulse: 86  Temp: 98.1 F (36.7 C)  TempSrc: Oral  SpO2: 98%  Weight: 296 lb (134.3 kg)  Height: 5\' 10"  (1.778 m)   Body mass index is 42.47 kg/m. Physical Exam  Constitutional: She is oriented to person, place, and time. She appears well-developed and well-nourished.  HENT:  Mouth/Throat: Oropharynx is clear and moist. No oropharyngeal exudate.  Eyes: Pupils are equal, round, and reactive to light. No scleral icterus.  Neck: Neck supple. Carotid bruit is not present. No tracheal deviation present. No  thyromegaly present.  Cardiovascular: Normal rate, regular rhythm, normal heart sounds and intact distal pulses. Exam reveals no gallop and no friction rub.  No murmur heard. No LE edema b/l. no calf TTP.   Pulmonary/Chest: Effort normal and breath sounds normal. No stridor. No respiratory distress. She has no wheezes. She has no rales.  Abdominal: Soft. Bowel sounds are normal. She exhibits no distension and no mass. There is no hepatomegaly. There is no tenderness. There is  no rebound and no guarding.  obesity  Musculoskeletal: She exhibits edema.  Lymphadenopathy:    She has no cervical adenopathy.  Neurological: She is alert and oriented to person, place, and time.  Skin: Skin is warm and dry. No rash noted.  Psychiatric: She has a normal mood and affect. Her behavior is normal. Judgment and thought content normal.    Labs reviewed: Basic Metabolic Panel: No results for input(s): NA, K, CL, CO2, GLUCOSE, BUN, CREATININE, CALCIUM, MG, PHOS, TSH in the last 8760 hours. Liver Function Tests: No results for input(s): AST, ALT, ALKPHOS, BILITOT, PROT, ALBUMIN in the last 8760 hours. No results for input(s): LIPASE, AMYLASE in the last 8760 hours. No results for input(s): AMMONIA in the last 8760 hours. CBC: No results for input(s): WBC, NEUTROABS, HGB, HCT, MCV, PLT in the last 8760 hours. Lipid Panel: No results for input(s): CHOL, HDL, LDLCALC, TRIG, CHOLHDL, LDLDIRECT in the last 8760 hours. Lab Results  Component Value Date   HGBA1C 5.4 05/02/2014    Procedures: No results found.  Assessment/Plan 1. Abnormal cervical Papanicolaou smear, unspecified abnormal pap finding Noted on PAP in 2016, she states she would rather establish with GYN for ongoing PAPs - Ambulatory referral to Gynecology  2. Breast cancer screening by mammogram - MM DIGITAL SCREENING BILATERAL; Future  3. Attention deficit disorder (ADD) without hyperactivity Controlled on adderall 20 mg 1-2 tablets  twice daily, does not take on the weekend  4. Mixed hyperlipidemia - not currently on medication.  - Lipid Panel; Future - COMPLETE METABOLIC PANEL WITH GFR; Future  5. Body mass index (BMI) 40.0-44.9, adult ([place]) -noted today, encouraged to increase activity and diet modifications.   6. Well adult exam -she is doing well. Plans to get cologuard, mamogram and referral placed for GYN for ongoing PAP information provided regarding the appropriate use of alcohol, regular self-examination of the breasts on a monthly basis, prevention of dental and periodontal disease, diet, regular sustained exercise for at least 30 minutes 5 times per week, routine screening interval for mammogram as recommended by the [place] and ACOG, importance of regular PAP smears, and recommended schedule for GI hemoccult testing, colonoscopy, cholesterol, thyroid and diabetes screening. - CBC with Differential/Platelets; Future  Next appt: follow up next week for fasting labs.  Carlos American. [place], [facility] [phone]

## 2018-06-15 ENCOUNTER — Other Ambulatory Visit: Payer: BLUE CROSS/BLUE SHIELD

## 2018-06-15 DIAGNOSIS — E782 Mixed hyperlipidemia: Secondary | ICD-10-CM | POA: Diagnosis not present

## 2018-06-15 DIAGNOSIS — Z0000 Encounter for general adult medical examination without abnormal findings: Secondary | ICD-10-CM

## 2018-06-15 LAB — LIPID PANEL
Cholesterol: 250 mg/dL — ABNORMAL HIGH (ref <200)
HDL: 54 mg/dL (ref >50)
LDL Cholesterol (Calc): 164 mg/dL (calc) — ABNORMAL HIGH
NON-HDL CHOLESTEROL (CALC): 196 mg/dL — AB (ref <130)
Total CHOL/HDL Ratio: 4.6 (calc) (ref <5.0)
Triglycerides: 170 mg/dL — ABNORMAL HIGH (ref <150)

## 2018-06-15 LAB — COMPLETE METABOLIC PANEL WITH GFR
AG Ratio: 1.5 (calc) (ref 1.0–2.5)
ALT: 18 U/L (ref 6–29)
AST: 18 U/L (ref 10–35)
Albumin: 4.1 g/dL (ref 3.6–5.1)
Alkaline phosphatase (APISO): 77 U/L (ref 33–130)
BUN: 15 mg/dL (ref 7–25)
CO2: 27 mmol/L (ref 20–32)
CREATININE: 0.69 mg/dL (ref 0.50–1.05)
Calcium: 9 mg/dL (ref 8.6–10.4)
Chloride: 103 mmol/L (ref 98–110)
GFR, EST NON AFRICAN AMERICAN: 97 mL/min/{1.73_m2} (ref >=60)
GFR, Est African American: 113 mL/min/{1.73_m2} (ref >=60)
GLUCOSE: 99 mg/dL (ref 65–99)
Globulin: 2.7 g/dL (calc) (ref 1.9–3.7)
Potassium: 4.4 mmol/L (ref 3.5–5.3)
Sodium: 138 mmol/L (ref 135–146)
Total Bilirubin: 0.5 mg/dL (ref 0.2–1.2)
Total Protein: 6.8 g/dL (ref 6.1–8.1)

## 2018-06-15 LAB — CBC WITH DIFFERENTIAL/PLATELET
Basophils Absolute: 47 cells/uL (ref 0–200)
Basophils Relative: 0.8 %
Eosinophils Absolute: 319 cells/uL (ref 15–500)
Eosinophils Relative: 5.4 %
HEMATOCRIT: 44.9 % (ref 35.0–45.0)
Hemoglobin: 15.2 g/dL (ref 11.7–15.5)
LYMPHS ABS: 1776 {cells}/uL (ref 850–3900)
MCH: 29.2 pg (ref 27.0–33.0)
MCHC: 33.9 g/dL (ref 32.0–36.0)
MCV: 86.3 fL (ref 80.0–100.0)
MPV: 9.4 fL (ref 7.5–12.5)
Monocytes Relative: 6.1 %
NEUTROS PCT: 57.6 %
Neutro Abs: 3398 cells/uL (ref 1500–7800)
PLATELETS: 248 10*3/uL (ref 140–400)
RBC: 5.2 10*6/uL — ABNORMAL HIGH (ref 3.80–5.10)
RDW: 13.2 % (ref 11.0–15.0)
TOTAL LYMPHOCYTE: 30.1 %
WBC: 5.9 10*3/uL (ref 3.8–10.8)
WBCMIX: 360 {cells}/uL (ref 200–950)

## 2018-06-22 ENCOUNTER — Other Ambulatory Visit: Payer: Self-pay | Admitting: Nurse Practitioner

## 2018-06-22 DIAGNOSIS — F988 Other specified behavioral and emotional disorders with onset usually occurring in childhood and adolescence: Secondary | ICD-10-CM

## 2018-06-22 MED ORDER — AMPHETAMINE-DEXTROAMPHETAMINE 20 MG PO TABS: ORAL_TABLET | ORAL | 0 refills | Status: DC

## 2018-06-22 NOTE — Telephone Encounter (Signed)
A medication refill was received from pharmacy for adderall 20 mg. Rx was pended to provider due to patient only receiving a limited quantity on 05/31/18. Rx was filled for #60 with is only a 15 day supply.

## 2018-06-28 ENCOUNTER — Other Ambulatory Visit: Payer: Self-pay | Admitting: Nurse Practitioner

## 2018-06-28 DIAGNOSIS — F988 Other specified behavioral and emotional disorders with onset usually occurring in childhood and adolescence: Secondary | ICD-10-CM

## 2018-06-28 DIAGNOSIS — Z1212 Encounter for screening for malignant neoplasm of rectum: Secondary | ICD-10-CM | POA: Diagnosis not present

## 2018-06-28 DIAGNOSIS — Z1211 Encounter for screening for malignant neoplasm of colon: Secondary | ICD-10-CM | POA: Diagnosis not present

## 2018-06-29 ENCOUNTER — Other Ambulatory Visit: Payer: Self-pay | Admitting: *Deleted

## 2018-06-29 DIAGNOSIS — F988 Other specified behavioral and emotional disorders with onset usually occurring in childhood and adolescence: Secondary | ICD-10-CM

## 2018-06-29 NOTE — Telephone Encounter (Signed)
Patient called regarding her Adderall Rx. Stated that she requested last week and never filled.  Medication was a NO PRINT in system and never sent to pharmacy on 06/22/18. Verified with Pharmacy and last RF was 05/31/18, spoke with Moshe Salisbury.  Also Verified with [facility], LR: 05/31/2018.  Patient is almost out of medication and upset that this has not been taken care of.   Pended Rx and sent to [facility] for approval.

## 2018-06-29 NOTE — Telephone Encounter (Signed)
rx just provided for her last week

## 2018-06-29 NOTE — Telephone Encounter (Signed)
[place] Database verified and compliance confirmed   

## 2018-06-30 MED ORDER — AMPHETAMINE-DEXTROAMPHETAMINE 20 MG PO TABS: 20.0000 mg | ORAL_TABLET | Freq: Two times a day (BID) | ORAL | 0 refills | Status: DC

## 2018-07-02 ENCOUNTER — Other Ambulatory Visit: Payer: Self-pay | Admitting: Nurse Practitioner

## 2018-07-02 ENCOUNTER — Telehealth: Payer: Self-pay

## 2018-07-02 DIAGNOSIS — Z1212 Encounter for screening for malignant neoplasm of rectum: Principal | ICD-10-CM

## 2018-07-02 DIAGNOSIS — Z1211 Encounter for screening for malignant neoplasm of colon: Secondary | ICD-10-CM

## 2018-07-02 LAB — COLOGUARD: Cologuard: POSITIVE

## 2018-07-02 NOTE — Telephone Encounter (Addendum)
Received fax from [employer] that patient cologuard results came back positive.Sherrie Mustache received copy in her folder.Results also abstracted

## 2018-07-02 NOTE — Telephone Encounter (Signed)
Tried to contact patient to give Cologuard Results, no answer. Left message for patient to call office.

## 2018-07-26 ENCOUNTER — Telehealth: Payer: Self-pay | Admitting: Nurse Practitioner

## 2018-07-26 DIAGNOSIS — F988 Other specified behavioral and emotional disorders with onset usually occurring in childhood and adolescence: Secondary | ICD-10-CM

## 2018-07-26 NOTE — Telephone Encounter (Signed)
Tried calling patient no answer, left voicemail to return call

## 2018-07-26 NOTE — Telephone Encounter (Signed)
Last OV 06/09/2018 Last refill 06/30/2018 Database check verified

## 2018-07-26 NOTE — Telephone Encounter (Signed)
Based on up to date the max dose of Adderall is 40 mg DAILY this is maximum of 2 tablets daily.

## 2018-07-28 MED ORDER — AMPHETAMINE-DEXTROAMPHETAMINE 20 MG PO TABS: 20.0000 mg | ORAL_TABLET | Freq: Two times a day (BID) | ORAL | 0 refills | Status: DC

## 2018-07-29 NOTE — Telephone Encounter (Signed)
Noted, as discussed she can go to psychiatrist if she feels like this will not properly treat her ADD however based on the guidelines [place] max dose in 24 hours is 40 mg. Will fwd this to Dr Mariea Clonts, Lead physician so she is aware.

## 2018-07-29 NOTE — Telephone Encounter (Signed)
Pt calling asking about the quantity of her Adderall, pt states she normally get #120, take 1-2 tablets 3-4 times daily. Per Janett Billow, NP pt should only be taking #60 1 tablet twice daily, pt got very upset because she can't get her usual quantity and stated she would be looking for another provider.

## 2018-08-20 ENCOUNTER — Other Ambulatory Visit: Payer: Self-pay | Admitting: Nurse Practitioner

## 2018-08-20 DIAGNOSIS — F988 Other specified behavioral and emotional disorders with onset usually occurring in childhood and adolescence: Secondary | ICD-10-CM

## 2018-08-20 NOTE — Telephone Encounter (Signed)
Last filled 07/28/2018   [place] Database verified and compliance confirmed

## 2018-08-23 ENCOUNTER — Other Ambulatory Visit: Payer: Self-pay | Admitting: Nurse Practitioner

## 2018-08-23 DIAGNOSIS — F988 Other specified behavioral and emotional disorders with onset usually occurring in childhood and adolescence: Secondary | ICD-10-CM

## 2018-08-23 NOTE — Telephone Encounter (Signed)
Last filled 07/28/2018   [place] Database verified and compliance confirmed

## 2018-08-24 ENCOUNTER — Other Ambulatory Visit: Payer: Self-pay | Admitting: Nurse Practitioner

## 2018-08-24 DIAGNOSIS — F988 Other specified behavioral and emotional disorders with onset usually occurring in childhood and adolescence: Secondary | ICD-10-CM

## 2018-08-24 NOTE — Telephone Encounter (Signed)
Refill was not decline it is pending to be refilled on 08/27/2018 when it is due. Her monthly quantity was changed based on max dose of medication that should have been prescribed based on the guidelines- which was discussed with her on previous phone encounters. She is free to see Dinah in the future for her PCP but guidelines will remain the same.

## 2018-08-24 NOTE — Telephone Encounter (Signed)
Patient called and stated that her Adderall Rx was DENIED and she wants to know why. Patient stated she is not happy with the care that she has received since Dr. Eulas Post left.   [place] Verified LR: 07/28/18 Explained to patient the reason it was denied was because it was too early to fill.   Patient Stated that she does not take these for fun, she takes them as she needs them. Was taking 1-1/2 twice daily and you decreased it. Thinks your acting in your best interest not hers. Does not think you like her and does not ever want to see you again. Does not trust you with her care. Stated you changed her whole Rx without even talking to her about it first. Thinks that is unfair.   Please Advise.

## 2018-08-25 NOTE — Telephone Encounter (Signed)
Spoke with patient. Patient states she has seen by a psychiatrist in the past and she would agree that her ADD is not controlled with this current dose and that is the issue. Patient expressed her disappointment with Janett Billow changing her medication without discussing it with her first and states Jessica's response is ridiculous. Lizanne said " You do not treat patient's based on guidelines, you treat them based on their individual needs."   Elecia would like to speak with the Engineer, building services. Arzu is aware that Faythe Dingwall is currently out of office and I give Faythe Dingwall her contact information.    Message printed and given to [facility]

## 2018-08-25 NOTE — Telephone Encounter (Signed)
Also it sounds like her ADD is not properly controlled with this dose and I recommend her seeing an attention specialist to do further evaluation and treatment. [facility] is on [facility] and she can call and make an appt there.

## 2018-08-27 ENCOUNTER — Other Ambulatory Visit: Payer: Self-pay | Admitting: Nurse Practitioner

## 2018-08-27 DIAGNOSIS — F988 Other specified behavioral and emotional disorders with onset usually occurring in childhood and adolescence: Secondary | ICD-10-CM

## 2018-08-27 MED ORDER — AMPHETAMINE-DEXTROAMPHETAMINE 20 MG PO TABS: 20.0000 mg | ORAL_TABLET | Freq: Two times a day (BID) | ORAL | 0 refills | Status: DC

## 2018-08-27 NOTE — Telephone Encounter (Signed)
[place] Database was checked in previous encounters this week

## 2018-09-22 ENCOUNTER — Encounter: Payer: Self-pay | Admitting: Nurse Practitioner

## 2018-09-24 ENCOUNTER — Other Ambulatory Visit: Payer: Self-pay | Admitting: Nurse Practitioner

## 2018-09-24 ENCOUNTER — Telehealth: Payer: Self-pay

## 2018-09-24 DIAGNOSIS — F988 Other specified behavioral and emotional disorders with onset usually occurring in childhood and adolescence: Secondary | ICD-10-CM

## 2018-09-24 MED ORDER — AMPHETAMINE-DEXTROAMPHETAMINE 20 MG PO TABS: 20.0000 mg | ORAL_TABLET | Freq: Two times a day (BID) | ORAL | 0 refills | Status: DC

## 2018-09-24 NOTE — Telephone Encounter (Signed)
Last filled 08/27/2018  [place] Database verified and compliance confirmed

## 2018-09-24 NOTE — Telephone Encounter (Signed)
Error regarding Telephone Encounter Note 09/24/2018 at 11:11 am  Stated that no referral was made. A referral was placed on 07/02/2018 to [facility]. Several attempts were made to contact patient to set up referral appointment with patient regarding positive results. Will trying calling patient back Monday if patient does not return call to office before then. After 3rd attempt is made will mail letter.

## 2018-09-24 NOTE — Telephone Encounter (Signed)
Tried calling patient again with Cologuard results. No answer. Left message for patient to call office concerning results.  Patient was contacted before and left message to please call office. Patient did not return call. No referral was placed to GI.

## 2018-09-27 NOTE — Telephone Encounter (Signed)
Tried calling patient in a 3rd attempt to discuss Cologuard results. No answer. Left message to return call to office. Will mail letter to patient.

## 2018-10-01 NOTE — Telephone Encounter (Signed)
Patient was called 3 times with unsuccessful attempts a letter has been mailed for patient to contact office. Will await patient to respond. Until then nothing further can be done at this time.

## 2018-10-26 ENCOUNTER — Other Ambulatory Visit: Payer: Self-pay | Admitting: Nurse Practitioner

## 2018-10-26 DIAGNOSIS — F988 Other specified behavioral and emotional disorders with onset usually occurring in childhood and adolescence: Secondary | ICD-10-CM

## 2018-10-26 MED ORDER — AMPHETAMINE-DEXTROAMPHETAMINE 20 MG PO TABS: 20.0000 mg | ORAL_TABLET | Freq: Two times a day (BID) | ORAL | 0 refills | Status: DC

## 2018-10-26 NOTE — Telephone Encounter (Signed)
Last filled 09/24/2018  [place] Database verified and compliance confirmed

## 2018-11-24 ENCOUNTER — Other Ambulatory Visit: Payer: Self-pay | Admitting: Nurse Practitioner

## 2018-11-24 DIAGNOSIS — F988 Other specified behavioral and emotional disorders with onset usually occurring in childhood and adolescence: Secondary | ICD-10-CM

## 2018-11-24 MED ORDER — AMPHETAMINE-DEXTROAMPHETAMINE 20 MG PO TABS: 20.0000 mg | ORAL_TABLET | Freq: Two times a day (BID) | ORAL | 0 refills | Status: DC

## 2018-11-24 NOTE — Telephone Encounter (Signed)
Last filled 10/26/2018  [place] Database verified and compliance confirmed   

## 2018-12-21 DIAGNOSIS — E782 Mixed hyperlipidemia: Secondary | ICD-10-CM | POA: Diagnosis not present

## 2018-12-21 DIAGNOSIS — N951 Menopausal and female climacteric states: Secondary | ICD-10-CM | POA: Diagnosis not present

## 2018-12-21 DIAGNOSIS — E559 Vitamin D deficiency, unspecified: Secondary | ICD-10-CM | POA: Diagnosis not present

## 2018-12-21 DIAGNOSIS — R635 Abnormal weight gain: Secondary | ICD-10-CM | POA: Diagnosis not present

## 2018-12-24 ENCOUNTER — Telehealth: Payer: Self-pay

## 2018-12-24 ENCOUNTER — Other Ambulatory Visit: Payer: Self-pay | Admitting: Nurse Practitioner

## 2018-12-24 DIAGNOSIS — Z1331 Encounter for screening for depression: Secondary | ICD-10-CM | POA: Diagnosis not present

## 2018-12-24 DIAGNOSIS — F988 Other specified behavioral and emotional disorders with onset usually occurring in childhood and adolescence: Secondary | ICD-10-CM

## 2018-12-24 DIAGNOSIS — N951 Menopausal and female climacteric states: Secondary | ICD-10-CM | POA: Diagnosis not present

## 2018-12-24 DIAGNOSIS — E782 Mixed hyperlipidemia: Secondary | ICD-10-CM | POA: Diagnosis not present

## 2018-12-24 DIAGNOSIS — Z6841 Body Mass Index (BMI) 40.0 and over, adult: Secondary | ICD-10-CM | POA: Diagnosis not present

## 2018-12-24 DIAGNOSIS — E559 Vitamin D deficiency, unspecified: Secondary | ICD-10-CM | POA: Diagnosis not present

## 2018-12-24 DIAGNOSIS — Z1339 Encounter for screening examination for other mental health and behavioral disorders: Secondary | ICD-10-CM | POA: Diagnosis not present

## 2018-12-24 MED ORDER — AMPHETAMINE-DEXTROAMPHETAMINE 20 MG PO TABS: 20.0000 mg | ORAL_TABLET | Freq: Two times a day (BID) | ORAL | 0 refills | Status: DC

## 2018-12-24 NOTE — Telephone Encounter (Signed)
Per Sherrie Mustache, NP patient needs an appointment to follow-up on positive Cologuard and cholesterol.   Left message on voicemail for patient to return call when available   I also sent patient a mychart message requesting a call to schedule an appointment

## 2018-12-24 NOTE — Telephone Encounter (Signed)
Last filled 11/24/2018  [place] Database verified and compliance confirmed

## 2018-12-27 ENCOUNTER — Telehealth: Payer: Self-pay | Admitting: *Deleted

## 2018-12-27 NOTE — Telephone Encounter (Signed)
Patient called and left message on Clinical Intake stating that she received a call stating that she needed an appointment, she was overdue. Patient is wanting to know what that appointment is for and why she only received a limited amount on her Rx.   Tried calling patient back and got a busy signal. Tried twice. Will try again later.

## 2018-12-28 DIAGNOSIS — E782 Mixed hyperlipidemia: Secondary | ICD-10-CM | POA: Diagnosis not present

## 2018-12-28 DIAGNOSIS — Z6841 Body Mass Index (BMI) 40.0 and over, adult: Secondary | ICD-10-CM | POA: Diagnosis not present

## 2018-12-28 NOTE — Telephone Encounter (Signed)
Patient notified and appointment scheduled. Patient stated that she DOES NOT want to see Janett Billow. Scheduled with Dinah.

## 2019-01-03 DIAGNOSIS — Z6841 Body Mass Index (BMI) 40.0 and over, adult: Secondary | ICD-10-CM | POA: Diagnosis not present

## 2019-01-03 DIAGNOSIS — E559 Vitamin D deficiency, unspecified: Secondary | ICD-10-CM | POA: Diagnosis not present

## 2019-01-04 ENCOUNTER — Ambulatory Visit (INDEPENDENT_AMBULATORY_CARE_PROVIDER_SITE_OTHER): Payer: BC Managed Care – PPO | Admitting: Family

## 2019-01-04 ENCOUNTER — Encounter: Payer: Self-pay | Admitting: Family

## 2019-01-04 ENCOUNTER — Other Ambulatory Visit: Payer: Self-pay

## 2019-01-04 VITALS — BP 118/78 | HR 89 | Temp 98.1°F | Ht 70.0 in | Wt 299.0 lb

## 2019-01-04 DIAGNOSIS — Z6841 Body Mass Index (BMI) 40.0 and over, adult: Secondary | ICD-10-CM

## 2019-01-04 DIAGNOSIS — R195 Other fecal abnormalities: Secondary | ICD-10-CM

## 2019-01-04 DIAGNOSIS — F988 Other specified behavioral and emotional disorders with onset usually occurring in childhood and adolescence: Secondary | ICD-10-CM

## 2019-01-04 MED ORDER — AMPHETAMINE-DEXTROAMPHETAMINE 20 MG PO TABS: 20.0000 mg | ORAL_TABLET | Freq: Two times a day (BID) | ORAL | 0 refills | Status: DC

## 2019-01-04 NOTE — Progress Notes (Signed)
Provider: Marlowe Sax FNP-C  Hamid Brookens, Nelda Bucks, NP  Patient Care Team: Chevelle Durr, Nelda Bucks, NP as PCP - General (Family Medicine)  Extended Emergency Contact Information Primary Emergency Contact: McDough,Peg Address: [place] [address] [place] Phone: [phone] Relation: Sister   Goals of care: Advanced Directive information Advanced Directives 06/09/2018  Does Patient Have a Medical Advance Directive? No  Would patient like information on creating a medical advance directive? -     Chief Complaint  Patient presents with  . Medical Management of Chronic Issues    follow-up, positive cologuard    HPI:  Pt is a 58 y.o. female seen today for an acute visit for follow up positive cologuard results.she had a cologuard done 06/28/2018 that resulted positive.she was previously followed up by PCP Sherrie Mustache NP.On chart review patient was contacted several times via phone without any success.Letter was also send for patient to contact [facility] office. She denies any acute issues during visit.she states has been working from home due COVID-19 restrictions.she is coping well.  She denies any abdominal pain,constipation,diarrhea,bloating or blood in the stool. Cologuard results discussed with patient this visit.  She is also overdue for her annual physical exam and labs will schedule this visit.  ADHD - request adderall refill send to pharmacy.states medication has been effective without any side effects.    Past Medical History:  Diagnosis Date  . ADD (attention deficit disorder) 05/02/2014  . Mixed hyperlipidemia 08/20/2016  . Obesity 05/02/2014   Past Surgical History:  Procedure Laterality Date  . GASTRIC BYPASS  2002    No Known Allergies  Outpatient Encounter Medications as of 01/04/2019  Medication Sig  . amphetamine-dextroamphetamine (ADDERALL) 20 MG tablet Take 1 tablet (20 mg total) by mouth 2 (two) times daily. for ADD   . ibuprofen (ADVIL,MOTRIN) 200 MG tablet Take 200 mg by mouth once a week.   . Multiple Vitamin (MULTIVITAMIN) tablet Take 1 tablet by mouth daily.  . [DISCONTINUED] polyethylene glycol powder (GLYCOLAX/MIRALAX) powder Take 17 g by mouth daily as needed for mild constipation. Sample provided   No facility-administered encounter medications on file as of 01/04/2019.     Review of Systems  Constitutional: Negative for appetite change, chills, fatigue, fever and unexpected weight change.  HENT: Negative for congestion, rhinorrhea, sinus pressure, sinus pain, sneezing and sore throat.   Respiratory: Negative for cough, chest tightness, shortness of breath and wheezing.   Cardiovascular: Negative for chest pain, palpitations and leg swelling.  Gastrointestinal: Negative for abdominal distention, abdominal pain, constipation, diarrhea, nausea and vomiting.  Genitourinary: Negative for difficulty urinating, dysuria, flank pain, frequency and urgency.  Musculoskeletal: Negative for gait problem.  Skin: Negative for color change, pallor and rash.  Neurological: Negative for dizziness and light-headedness.       Occasional headaches  Psychiatric/Behavioral: Negative for agitation and sleep disturbance. The patient is not nervous/anxious.     Immunization History  Administered Date(s) Administered  . Influenza,inj,Quad PF,6+ Mos 05/11/2015, 08/20/2016, 05/21/2017, 06/09/2018  . Tdap 07/14/2008   Pertinent  Health Maintenance Due  Topic Date Due  . MAMMOGRAM  12/13/2011  . COLONOSCOPY  12/13/2011  . PAP SMEAR-Modifier  05/10/2018  . INFLUENZA VACCINE  02/12/2019   Fall Risk  01/04/2019 06/09/2018 01/16/2017 08/20/2016 11/21/2015  Falls in the past year? 0 0 No No No  Number falls in past yr: 0 - - - -  Injury with Fall? 0 - - - -  Vitals:   01/04/19 0940  BP: 118/78  Pulse: 89  Temp: 98.1 F (36.7 C)  TempSrc: Oral  SpO2: 97%  Weight: 299 lb (135.6 kg)  Height: 5\' 10"  (1.778 m)    Body mass index is 42.9 kg/m. Physical Exam Vitals signs reviewed.  Constitutional:      General: She is not in acute distress.    Appearance: She is obese. She is not ill-appearing.  HENT:     Head: Normocephalic.     Mouth/Throat:     Mouth: Mucous membranes are moist.     Pharynx: Oropharynx is clear. No oropharyngeal exudate or posterior oropharyngeal erythema.  Eyes:     General: No scleral icterus.       Right eye: No discharge.        Left eye: No discharge.     Conjunctiva/sclera: Conjunctivae normal.     Pupils: Pupils are equal, round, and reactive to light.  Cardiovascular:     Rate and Rhythm: Normal rate and regular rhythm.     Pulses: Normal pulses.     Heart sounds: Normal heart sounds. No murmur. No friction rub. No gallop.   Pulmonary:     Effort: Pulmonary effort is normal. No respiratory distress.     Breath sounds: Normal breath sounds. No wheezing, rhonchi or rales.  Chest:     Chest wall: No tenderness.  Abdominal:     General: Bowel sounds are normal. There is no distension.     Palpations: Abdomen is soft. There is no mass.     Tenderness: There is no abdominal tenderness. There is no right CVA tenderness, left CVA tenderness, guarding or rebound.  Musculoskeletal: Normal range of motion.        General: No swelling or tenderness.     Right lower leg: No edema.     Left lower leg: No edema.  Skin:    General: Skin is warm and dry.     Coloration: Skin is not pale.     Findings: No bruising, erythema or rash.  Neurological:     Mental Status: She is alert and oriented to person, place, and time.     Cranial Nerves: No cranial nerve deficit.     Sensory: No sensory deficit.     Motor: No weakness.     Coordination: Coordination normal.     Gait: Gait normal.  Psychiatric:        Mood and Affect: Mood normal.        Behavior: Behavior normal.        Thought Content: Thought content normal.        Judgment: Judgment normal.    Labs reviewed:  Recent Labs    06/15/18 0827  NA 138  K 4.4  CL 103  CO2 27  GLUCOSE 99  BUN 15  CREATININE 0.69  CALCIUM 9.0   Recent Labs    06/15/18 0827  AST 18  ALT 18  BILITOT 0.5  PROT 6.8   Recent Labs    06/15/18 0827  WBC 5.9  NEUTROABS 3,398  HGB 15.2  HCT 44.9  MCV 86.3  PLT 248   Lab Results  Component Value Date   TSH 3.06 05/23/2016   Lab Results  Component Value Date   HGBA1C 5.4 05/02/2014   Lab Results  Component Value Date   CHOL 250 (H) 06/15/2018   HDL 54 06/15/2018   LDLCALC 164 (H) 06/15/2018   TRIG 170 (H) 06/15/2018  CHOLHDL 4.6 06/15/2018    Significant Diagnostic Results in last 30 days:  No results found.  Assessment/Plan  1. Positive colorectal cancer screening using Cologuard test Asymptomatic.Negative exam findings. - Ambulatory referral to Gastroenterology for further evaluation.   2. Attention deficit disorder (ADD) without hyperactivity Symptoms controlled. - amphetamine-dextroamphetamine (ADDERALL) 20 MG tablet; Take 1 tablet (20 mg total) by mouth 2 (two) times daily. for ADD  Dispense: 60 tablet; Refill: 0  3. Body mass index (BMI) of 40.1-44.9 in adult ([facility]) - Hemoglobin A1c; Future - TSH; Future - Lipid panel; Future - Basic metabolic panel; Future - Hepatic function panel; Future  4. Morbid Obesity  Low carbohydrate,low saturated fats and high vegetable diet.Increase physical activity/exercise x 3 per weeks for 30 minutes.   Family/ staff Communication: Reviewed plan of care with patient Labs/tests ordered:  - Hemoglobin A1c; Future - TSH; Future - Lipid panel; Future - Basic metabolic panel; Future - Hepatic function panel; Future  Next appointment: 4 weeks for Physical Exam due for mammogram,Tdap,Pap smear.  Sandrea Hughs, NP

## 2019-01-05 ENCOUNTER — Encounter: Payer: Self-pay | Admitting: Gastroenterology

## 2019-01-05 NOTE — Telephone Encounter (Signed)
Patient seen Marlowe Sax, NP

## 2019-01-06 ENCOUNTER — Other Ambulatory Visit: Payer: Self-pay

## 2019-01-06 DIAGNOSIS — F988 Other specified behavioral and emotional disorders with onset usually occurring in childhood and adolescence: Secondary | ICD-10-CM

## 2019-01-10 DIAGNOSIS — E782 Mixed hyperlipidemia: Secondary | ICD-10-CM | POA: Diagnosis not present

## 2019-01-10 DIAGNOSIS — Z6841 Body Mass Index (BMI) 40.0 and over, adult: Secondary | ICD-10-CM | POA: Diagnosis not present

## 2019-01-17 DIAGNOSIS — E782 Mixed hyperlipidemia: Secondary | ICD-10-CM | POA: Diagnosis not present

## 2019-01-17 DIAGNOSIS — Z6841 Body Mass Index (BMI) 40.0 and over, adult: Secondary | ICD-10-CM | POA: Diagnosis not present

## 2019-01-17 DIAGNOSIS — E559 Vitamin D deficiency, unspecified: Secondary | ICD-10-CM | POA: Diagnosis not present

## 2019-01-18 ENCOUNTER — Other Ambulatory Visit: Payer: BC Managed Care – PPO

## 2019-01-18 ENCOUNTER — Other Ambulatory Visit: Payer: Self-pay

## 2019-01-18 DIAGNOSIS — E782 Mixed hyperlipidemia: Secondary | ICD-10-CM | POA: Diagnosis not present

## 2019-01-18 DIAGNOSIS — Z6841 Body Mass Index (BMI) 40.0 and over, adult: Secondary | ICD-10-CM

## 2019-01-18 DIAGNOSIS — R7303 Prediabetes: Secondary | ICD-10-CM | POA: Diagnosis not present

## 2019-01-19 LAB — BASIC METABOLIC PANEL
BUN: 15 mg/dL (ref 7–25)
CO2: 26 mmol/L (ref 20–32)
Calcium: 9.1 mg/dL (ref 8.6–10.4)
Chloride: 104 mmol/L (ref 98–110)
Creat: 0.82 mg/dL (ref 0.50–1.05)
Glucose, Bld: 107 mg/dL — ABNORMAL HIGH (ref 65–99)
Potassium: 4.4 mmol/L (ref 3.5–5.3)
Sodium: 138 mmol/L (ref 135–146)

## 2019-01-19 LAB — HEPATIC FUNCTION PANEL
AG Ratio: 1.4 (calc) (ref 1.0–2.5)
ALT: 17 U/L (ref 6–29)
AST: 17 U/L (ref 10–35)
Albumin: 4 g/dL (ref 3.6–5.1)
Alkaline phosphatase (APISO): 84 U/L (ref 37–153)
Bilirubin, Direct: 0.1 mg/dL (ref 0.0–0.2)
Globulin: 2.8 g/dL (calc) (ref 1.9–3.7)
Indirect Bilirubin: 0.4 mg/dL (calc) (ref 0.2–1.2)
Total Bilirubin: 0.5 mg/dL (ref 0.2–1.2)
Total Protein: 6.8 g/dL (ref 6.1–8.1)

## 2019-01-19 LAB — HEMOGLOBIN A1C
Hgb A1c MFr Bld: 5.4 % of total Hgb (ref <5.7)
Mean Plasma Glucose: 108 (calc)
eAG (mmol/L): 6 (calc)

## 2019-01-19 LAB — LIPID PANEL
Cholesterol: 180 mg/dL (ref <200)
HDL: 43 mg/dL — ABNORMAL LOW (ref >=50)
LDL Cholesterol (Calc): 118 mg/dL (calc) — ABNORMAL HIGH
Non-HDL Cholesterol (Calc): 137 mg/dL (calc) — ABNORMAL HIGH (ref <130)
Total CHOL/HDL Ratio: 4.2 (calc) (ref <5.0)
Triglycerides: 90 mg/dL (ref <150)

## 2019-01-19 LAB — TSH: TSH: 2.51 mIU/L (ref 0.40–4.50)

## 2019-01-24 DIAGNOSIS — E782 Mixed hyperlipidemia: Secondary | ICD-10-CM | POA: Diagnosis not present

## 2019-01-24 DIAGNOSIS — Z6841 Body Mass Index (BMI) 40.0 and over, adult: Secondary | ICD-10-CM | POA: Diagnosis not present

## 2019-01-31 DIAGNOSIS — Z6841 Body Mass Index (BMI) 40.0 and over, adult: Secondary | ICD-10-CM | POA: Diagnosis not present

## 2019-01-31 DIAGNOSIS — E559 Vitamin D deficiency, unspecified: Secondary | ICD-10-CM | POA: Diagnosis not present

## 2019-02-04 ENCOUNTER — Ambulatory Visit (AMBULATORY_SURGERY_CENTER): Payer: Self-pay

## 2019-02-04 ENCOUNTER — Other Ambulatory Visit: Payer: Self-pay

## 2019-02-04 VITALS — Ht 70.0 in | Wt 285.0 lb

## 2019-02-04 DIAGNOSIS — Z1211 Encounter for screening for malignant neoplasm of colon: Secondary | ICD-10-CM

## 2019-02-04 MED ORDER — NA SULFATE-K SULFATE-MG SULF 17.5-3.13-1.6 GM/177ML PO SOLN: 1.0000 | Freq: Once | ORAL | 0 refills | Status: AC

## 2019-02-04 NOTE — Progress Notes (Signed)
Denies allergies to eggs or soy products. Denies complication of anesthesia or sedation. Denies use of weight loss medication. Denies use of O2.   Emmi instructions given for colonoscopy.  Pre-Visit was conducted by phone due to Covid 19. Instructions were reviewed and mailed to patients confirmed home address. A 15.00 coupon for Suprep was given to the patient. Patient was encouraged to call if she had any questions regarding instructions.

## 2019-02-07 ENCOUNTER — Other Ambulatory Visit: Payer: Self-pay

## 2019-02-07 DIAGNOSIS — E782 Mixed hyperlipidemia: Secondary | ICD-10-CM | POA: Diagnosis not present

## 2019-02-07 DIAGNOSIS — F988 Other specified behavioral and emotional disorders with onset usually occurring in childhood and adolescence: Secondary | ICD-10-CM

## 2019-02-07 DIAGNOSIS — Z6841 Body Mass Index (BMI) 40.0 and over, adult: Secondary | ICD-10-CM | POA: Diagnosis not present

## 2019-02-08 MED ORDER — AMPHETAMINE-DEXTROAMPHETAMINE 20 MG PO TABS: 20.0000 mg | ORAL_TABLET | Freq: Two times a day (BID) | ORAL | 0 refills | Status: DC

## 2019-02-14 DIAGNOSIS — E782 Mixed hyperlipidemia: Secondary | ICD-10-CM | POA: Diagnosis not present

## 2019-02-14 DIAGNOSIS — E559 Vitamin D deficiency, unspecified: Secondary | ICD-10-CM | POA: Diagnosis not present

## 2019-02-14 DIAGNOSIS — Z6841 Body Mass Index (BMI) 40.0 and over, adult: Secondary | ICD-10-CM | POA: Diagnosis not present

## 2019-02-18 ENCOUNTER — Encounter: Payer: Self-pay | Admitting: Gastroenterology

## 2019-02-21 DIAGNOSIS — E782 Mixed hyperlipidemia: Secondary | ICD-10-CM | POA: Diagnosis not present

## 2019-02-21 DIAGNOSIS — Z6841 Body Mass Index (BMI) 40.0 and over, adult: Secondary | ICD-10-CM | POA: Diagnosis not present

## 2019-02-22 ENCOUNTER — Other Ambulatory Visit: Payer: Self-pay

## 2019-02-22 ENCOUNTER — Ambulatory Visit (INDEPENDENT_AMBULATORY_CARE_PROVIDER_SITE_OTHER): Payer: BC Managed Care – PPO | Admitting: Family

## 2019-02-22 ENCOUNTER — Encounter: Payer: Self-pay | Admitting: Family

## 2019-02-22 VITALS — BP 122/80 | HR 88 | Temp 97.5°F | Ht 70.0 in | Wt 284.8 lb

## 2019-02-22 DIAGNOSIS — Z0000 Encounter for general adult medical examination without abnormal findings: Secondary | ICD-10-CM

## 2019-02-22 DIAGNOSIS — E782 Mixed hyperlipidemia: Secondary | ICD-10-CM

## 2019-02-22 DIAGNOSIS — Z6841 Body Mass Index (BMI) 40.0 and over, adult: Secondary | ICD-10-CM

## 2019-02-22 DIAGNOSIS — Z23 Encounter for immunization: Secondary | ICD-10-CM | POA: Diagnosis not present

## 2019-02-22 MED ORDER — TETANUS-DIPHTH-ACELL PERTUSSIS 5-2-15.5 LF-MCG/0.5 IM SUSP: 0.5000 mL | Freq: Once | INTRAMUSCULAR | 0 refills | Status: AC

## 2019-02-22 NOTE — Progress Notes (Signed)
Provider: Marlowe Sax FNP-C   Devetta Hagenow, Nelda Bucks, NP  Patient Care Team: Otilia Kareem, Nelda Bucks, NP as PCP - General (Family Medicine)  Extended Emergency Contact Information Primary Emergency Contact: McDough,Peg Address: [place] [address] [place] Phone: [phone] Relation: Sister  Code Status:Full Code  Goals of care: Advanced Directive information Advanced Directives 06/09/2018  Does Patient Have a Medical Advance Directive? No  Would patient like information on creating a medical advance directive? -     Chief Complaint  Patient presents with  . Medical Management of Chronic Issues    2 month follow up   . Quality Metric Gaps    Due for Tetanus, Colonoscopy, Pap smear  . Annual Exam    HPI:  Pt is a 58 y.o. female seen today for Annual exam and medical management of chronic diseases.she denies any acute issues during visit.she is due for a Tetanus vaccine.she states had a colonoscopy appointment but was told needed to have someone to drive her back home after the procedure but has no relative or friends around here that can help.Her daughter and son lives out of state will have to arrange for another appointment when daughter is available. She is due for her pap smear today.she reports no issues with bleeding or discharge.    Past Medical History:  Diagnosis Date  . ADD (attention deficit disorder) 05/02/2014  . Allergy   . GERD (gastroesophageal reflux disease)   . Mixed hyperlipidemia 08/20/2016  . Obesity 05/02/2014   Past Surgical History:  Procedure Laterality Date  . COSMETIC SURGERY    . GASTRIC BYPASS  2002    No Known Allergies  Allergies as of 02/22/2019   No Known Allergies     Medication List       Accurate as of February 22, 2019  9:47 AM. If you have any questions, ask your nurse or doctor.        amphetamine-dextroamphetamine 20 MG tablet Commonly known as: ADDERALL Take 1 tablet (20 mg  total) by mouth 2 (two) times daily. for ADD   ibuprofen 200 MG tablet Commonly known as: ADVIL Take 200 mg by mouth as needed.   multivitamin tablet Take 1 tablet by mouth daily.   OVER THE COUNTER MEDICATION Vitamin D 3, One capsule daily.   OVER THE COUNTER MEDICATION B 12 injections, Once a week.   Tdap 11-12-13.5 LF-MCG/0.5 injection Commonly known as: ADACEL Inject 0.5 mLs into the muscle once for 1 dose.       Review of Systems  Constitutional: Negative for chills, fatigue and fever.  HENT: Negative for congestion, dental problem, rhinorrhea, sinus pressure, sinus pain, sneezing and sore throat.   Eyes: Positive for visual disturbance. Negative for discharge, redness and itching.       Wears eye glasses follows Ophthalmology   Respiratory: Negative for cough, chest tightness, shortness of breath and wheezing.   Cardiovascular: Negative for chest pain, palpitations and leg swelling.  Gastrointestinal: Negative for abdominal distention, abdominal pain, constipation, diarrhea, nausea and vomiting.  Genitourinary: Negative for difficulty urinating, dysuria, flank pain, frequency and urgency.  Musculoskeletal: Negative for arthralgias, back pain and gait problem.  Skin: Negative for color change, pallor, rash and wound.  Neurological: Negative for dizziness, weakness, light-headedness, numbness and headaches.  Hematological: Does not bruise/bleed easily.  Psychiatric/Behavioral: Negative for agitation, sleep disturbance and suicidal ideas. The patient is not nervous/anxious.  Immunization History  Administered Date(s) Administered  . Influenza,inj,Quad PF,6+ Mos 05/11/2015, 08/20/2016, 05/21/2017, 06/09/2018  . Tdap 07/14/2008   Pertinent  Health Maintenance Due  Topic Date Due  . MAMMOGRAM  12/13/2011  . COLONOSCOPY  12/13/2011  . PAP SMEAR-Modifier  05/10/2018  . INFLUENZA VACCINE  02/12/2019   Fall Risk  01/04/2019 06/09/2018 01/16/2017 08/20/2016 11/21/2015  Falls  in the past year? 0 0 No No No  Number falls in past yr: 0 - - - -  Injury with Fall? 0 - - - -    Vitals:   02/22/19 0905  BP: 122/80  Pulse: 88  Temp: (!) 97.5 F (36.4 C)  TempSrc: Oral  SpO2: 97%  Weight: 284 lb 12.8 oz (129.2 kg)  Height: 5' 10"  (1.778 m)   Body mass index is 40.86 kg/m. Physical Exam Vitals signs reviewed.  Constitutional:      General: She is not in acute distress.    Appearance: She is obese. She is not ill-appearing.  HENT:     Head: Normocephalic.     Right Ear: Tympanic membrane, ear canal and external ear normal. There is no impacted cerumen.     Left Ear: Tympanic membrane, ear canal and external ear normal. There is no impacted cerumen.     Nose: Nose normal. No congestion or rhinorrhea.     Mouth/Throat:     Mouth: Mucous membranes are moist.     Pharynx: Oropharynx is clear. No oropharyngeal exudate or posterior oropharyngeal erythema.  Eyes:     General: No scleral icterus.       Right eye: No discharge.        Left eye: No discharge.     Extraocular Movements: Extraocular movements intact.     Conjunctiva/sclera: Conjunctivae normal.     Pupils: Pupils are equal, round, and reactive to light.     Comments: Eye glasses in place   Neck:     Musculoskeletal: Normal range of motion. No neck rigidity or muscular tenderness.     Vascular: No carotid bruit.  Cardiovascular:     Rate and Rhythm: Normal rate and regular rhythm.     Pulses: Normal pulses.     Heart sounds: Normal heart sounds. No murmur. No friction rub. No gallop.   Pulmonary:     Effort: Pulmonary effort is normal. No respiratory distress.     Breath sounds: Normal breath sounds. No wheezing, rhonchi or rales.  Chest:     Chest wall: No tenderness.  Abdominal:     General: Bowel sounds are normal. There is no distension.     Palpations: Abdomen is soft. There is no mass.     Tenderness: There is no abdominal tenderness. There is no right CVA tenderness, left CVA  tenderness, guarding or rebound.  Genitourinary:    Exam position: Lithotomy position.     Vagina: Normal.     Cervix: Normal.     Uterus: Normal.      Adnexa: Right adnexa normal and left adnexa normal.  Musculoskeletal: Normal range of motion.        General: No swelling or tenderness.     Right lower leg: No edema.     Left lower leg: No edema.  Lymphadenopathy:     Cervical: No cervical adenopathy.  Skin:    General: Skin is warm and dry.     Coloration: Skin is not pale.     Findings: No bruising, erythema or rash.  Neurological:  Mental Status: She is alert and oriented to person, place, and time.     Cranial Nerves: No cranial nerve deficit.     Sensory: No sensory deficit.     Motor: No weakness.     Coordination: Coordination normal.     Gait: Gait normal.  Psychiatric:        Mood and Affect: Mood normal.        Behavior: Behavior normal.        Thought Content: Thought content normal.        Judgment: Judgment normal.    Labs reviewed: Recent Labs    06/15/18 0827 01/18/19 0827  NA 138 138  K 4.4 4.4  CL 103 104  CO2 27 26  GLUCOSE 99 107*  BUN 15 15  CREATININE 0.69 0.82  CALCIUM 9.0 9.1   Recent Labs    06/15/18 0827 01/18/19 0827  AST 18 17  ALT 18 17  BILITOT 0.5 0.5  PROT 6.8 6.8   Recent Labs    06/15/18 0827  WBC 5.9  NEUTROABS 3,398  HGB 15.2  HCT 44.9  MCV 86.3  PLT 248   Lab Results  Component Value Date   TSH 2.51 01/18/2019   Lab Results  Component Value Date   HGBA1C 5.4 01/18/2019   Lab Results  Component Value Date   CHOL 180 01/18/2019   HDL 43 (L) 01/18/2019   LDLCALC 118 (H) 01/18/2019   TRIG 90 01/18/2019   CHOLHDL 4.2 01/18/2019    Significant Diagnostic Results in last 30 days:  No results found.  Assessment/Plan 1. Routine general medical examination at a health care facility Negative exam findings. Up to date with immunization. Medication and labs reviewed patient counselled regarding yearly  exam, prevention of dental and periodontal disease, diet, regular sustained exercise for at least 30 minutes x 3 /week,COVID-19 hand hygiene, mask and social distancing per CDC guidelines.Monthly breast exam. Proper use of sun screen and protective clothing, recommended schedule for routine labs.[place] colonoscopy when daughter available to assist with transportation after procedure.  - Pap IG ([facility]) - CBC with Differential/Platelet; Future - CMP with eGFR([facility]); Future - TSH; Future  2. Mixed hyperlipidemia Latest LDL not at goal.Low carbohydrate,low saturated fats and high vegetables diet.encouraged to increase physical activity/exercise at least three times  Per week for 30 minutes. - Lipid panel; Future  3. Need for Tdap vaccination Tdap vaccine send to pharmacy.   4. Body mass index (BMI) 40.0-44.9, adult (HCC) BMI 40.86 dietary and life style modification recommended.  - TSH; Future  5. Morbid obesity ([facility]) dietary and life style modification recommended.continue to follow up with life coach.    Family/ staff Communication: Reviewed plan of care with patient.   Labs/tests ordered:  - Pap IG (Image Guided) - CBC with Differential/Platelet; Future - CMP with eGFR([facility]); Future - TSH; Future Sandrea Hughs, NP

## 2019-02-22 NOTE — Patient Instructions (Signed)

## 2019-02-23 LAB — PAP IG (IMAGE GUIDED)

## 2019-02-28 DIAGNOSIS — E559 Vitamin D deficiency, unspecified: Secondary | ICD-10-CM | POA: Diagnosis not present

## 2019-02-28 DIAGNOSIS — Z6841 Body Mass Index (BMI) 40.0 and over, adult: Secondary | ICD-10-CM | POA: Diagnosis not present

## 2019-03-08 DIAGNOSIS — E782 Mixed hyperlipidemia: Secondary | ICD-10-CM | POA: Diagnosis not present

## 2019-03-08 DIAGNOSIS — Z6839 Body mass index (BMI) 39.0-39.9, adult: Secondary | ICD-10-CM | POA: Diagnosis not present

## 2019-03-10 ENCOUNTER — Other Ambulatory Visit: Payer: Self-pay

## 2019-03-10 DIAGNOSIS — F988 Other specified behavioral and emotional disorders with onset usually occurring in childhood and adolescence: Secondary | ICD-10-CM

## 2019-03-10 MED ORDER — AMPHETAMINE-DEXTROAMPHETAMINE 20 MG PO TABS: 20.0000 mg | ORAL_TABLET | Freq: Two times a day (BID) | ORAL | 0 refills | Status: DC

## 2019-03-10 NOTE — Telephone Encounter (Signed)
Last filled in epic on 02/08/2019, rx request sent to [place], Nelda Bucks, NP to review [place] database and approve if necessary

## 2019-03-15 DIAGNOSIS — E782 Mixed hyperlipidemia: Secondary | ICD-10-CM | POA: Diagnosis not present

## 2019-03-15 DIAGNOSIS — Z6839 Body mass index (BMI) 39.0-39.9, adult: Secondary | ICD-10-CM | POA: Diagnosis not present

## 2019-03-15 DIAGNOSIS — E559 Vitamin D deficiency, unspecified: Secondary | ICD-10-CM | POA: Diagnosis not present

## 2019-03-22 DIAGNOSIS — Z6841 Body Mass Index (BMI) 40.0 and over, adult: Secondary | ICD-10-CM | POA: Diagnosis not present

## 2019-03-22 DIAGNOSIS — E782 Mixed hyperlipidemia: Secondary | ICD-10-CM | POA: Diagnosis not present

## 2019-03-29 DIAGNOSIS — E559 Vitamin D deficiency, unspecified: Secondary | ICD-10-CM | POA: Diagnosis not present

## 2019-03-29 DIAGNOSIS — Z6839 Body mass index (BMI) 39.0-39.9, adult: Secondary | ICD-10-CM | POA: Diagnosis not present

## 2019-04-05 DIAGNOSIS — E782 Mixed hyperlipidemia: Secondary | ICD-10-CM | POA: Diagnosis not present

## 2019-04-05 DIAGNOSIS — E559 Vitamin D deficiency, unspecified: Secondary | ICD-10-CM | POA: Diagnosis not present

## 2019-04-05 DIAGNOSIS — Z6839 Body mass index (BMI) 39.0-39.9, adult: Secondary | ICD-10-CM | POA: Diagnosis not present

## 2019-04-07 ENCOUNTER — Other Ambulatory Visit: Payer: Self-pay

## 2019-04-07 DIAGNOSIS — F988 Other specified behavioral and emotional disorders with onset usually occurring in childhood and adolescence: Secondary | ICD-10-CM

## 2019-04-07 MED ORDER — AMPHETAMINE-DEXTROAMPHETAMINE 20 MG PO TABS: 20.0000 mg | ORAL_TABLET | Freq: Two times a day (BID) | ORAL | 0 refills | Status: DC

## 2019-04-07 NOTE — Telephone Encounter (Signed)
Checked [place] database 03/10/19 date of last refill for total 60 tablets. Last office visit 02/22/2019 and next office visit 02/27/20. Routing to provider for approval

## 2019-04-19 DIAGNOSIS — Z6838 Body mass index (BMI) 38.0-38.9, adult: Secondary | ICD-10-CM | POA: Diagnosis not present

## 2019-04-19 DIAGNOSIS — E559 Vitamin D deficiency, unspecified: Secondary | ICD-10-CM | POA: Diagnosis not present

## 2019-04-19 DIAGNOSIS — E782 Mixed hyperlipidemia: Secondary | ICD-10-CM | POA: Diagnosis not present

## 2019-05-03 DIAGNOSIS — Z6838 Body mass index (BMI) 38.0-38.9, adult: Secondary | ICD-10-CM | POA: Diagnosis not present

## 2019-05-03 DIAGNOSIS — E782 Mixed hyperlipidemia: Secondary | ICD-10-CM | POA: Diagnosis not present

## 2019-05-06 ENCOUNTER — Other Ambulatory Visit: Payer: Self-pay

## 2019-05-06 DIAGNOSIS — F988 Other specified behavioral and emotional disorders with onset usually occurring in childhood and adolescence: Secondary | ICD-10-CM

## 2019-05-06 MED ORDER — AMPHETAMINE-DEXTROAMPHETAMINE 20 MG PO TABS: 20.0000 mg | ORAL_TABLET | Freq: Two times a day (BID) | ORAL | 0 refills | Status: DC

## 2019-05-06 NOTE — Telephone Encounter (Signed)
Last filled in epic on 04/07/2019

## 2019-06-10 ENCOUNTER — Other Ambulatory Visit: Payer: Self-pay

## 2019-06-10 DIAGNOSIS — F988 Other specified behavioral and emotional disorders with onset usually occurring in childhood and adolescence: Secondary | ICD-10-CM

## 2019-06-11 ENCOUNTER — Other Ambulatory Visit: Payer: Self-pay

## 2019-06-11 DIAGNOSIS — F988 Other specified behavioral and emotional disorders with onset usually occurring in childhood and adolescence: Secondary | ICD-10-CM

## 2019-06-13 MED ORDER — AMPHETAMINE-DEXTROAMPHETAMINE 20 MG PO TABS: 20.0000 mg | ORAL_TABLET | Freq: Two times a day (BID) | ORAL | 0 refills | Status: DC

## 2019-06-13 NOTE — Telephone Encounter (Signed)
Left message on voicemail for patient to return call when available    Reason for call: Patient needs to schedule a follow-up with [facility]ah to updated controlled substance contract.  RX last filled 05/06/2019

## 2019-06-13 NOTE — Telephone Encounter (Deleted)
Patient h

## 2019-06-13 NOTE — Telephone Encounter (Signed)
Patient returned call and scheduled for 06/28/2019 to update contract

## 2019-06-13 NOTE — Telephone Encounter (Signed)
Patient has 2 requests for this medication.  Each request will be sent separately. The CMAs are not currently (? Permanently) able to refuse medications.  Not sure if this is just controlled substances or all medication refill requests.

## 2019-06-28 ENCOUNTER — Other Ambulatory Visit: Payer: Self-pay

## 2019-06-28 ENCOUNTER — Encounter: Payer: Self-pay | Admitting: Family

## 2019-06-28 ENCOUNTER — Ambulatory Visit (INDEPENDENT_AMBULATORY_CARE_PROVIDER_SITE_OTHER): Payer: BC Managed Care – PPO | Admitting: Family

## 2019-06-28 VITALS — BP 110/80 | HR 87 | Temp 97.7°F | Ht 70.0 in | Wt 266.8 lb

## 2019-06-28 DIAGNOSIS — Z1231 Encounter for screening mammogram for malignant neoplasm of breast: Secondary | ICD-10-CM | POA: Diagnosis not present

## 2019-06-28 DIAGNOSIS — Z23 Encounter for immunization: Secondary | ICD-10-CM

## 2019-06-28 DIAGNOSIS — Z1159 Encounter for screening for other viral diseases: Secondary | ICD-10-CM

## 2019-06-28 DIAGNOSIS — F988 Other specified behavioral and emotional disorders with onset usually occurring in childhood and adolescence: Secondary | ICD-10-CM | POA: Diagnosis not present

## 2019-06-28 MED ORDER — TETANUS-DIPHTH-ACELL PERTUSSIS 5-2-15.5 LF-MCG/0.5 IM SUSP: 0.5000 mL | Freq: Once | INTRAMUSCULAR | 0 refills | Status: AC

## 2019-06-28 NOTE — Progress Notes (Signed)
Provider: Marlowe Sax FNP-C  Alexandra Proctor, Alexandra Bucks, NP  Patient Care Team: Kalvyn Desa, Alexandra Bucks, NP as PCP - General (Family Medicine)  Extended Emergency Contact Information Primary Emergency Contact: Proctor,Alexandra Address: [place] [address] [place] Phone: [phone] Relation: Sister  Code Status: Full code  Goals of care: Advanced Directive information Advanced Directives 06/09/2018  Does Patient Have a Medical Advance Directive? No  Would patient like information on creating a medical advance directive? -     Chief Complaint  Patient presents with  . Medical Management of Chronic Issues    Medication management contract update   . Quality Metric Gaps    Patient states she would like to get flu shot, Hep C Screening, Mammogram and Tetanus     HPI:  Pt is a 58 y.o. female seen today for an acute visit for medication management.she is currently on Adderall 20 mg tablet twice daily.she states was previous on higher dose.she is on maximum daily required dose of adderrall.she states her job requires a lot of concentration " I can't make a single mistake".No hyperactive ness reported.Discussed option for Psychiatry evaluation for alternative option.will provider Pyschiatry numbers for her to contact Psychiatry. Health maintenance:  She is due for Hep C screen.does not recall being tested in the past.considers self low risk. Also due for influenza vaccine and tdap.she will receive Flu shot her at the office.will send Tdap to pharmacy.she denies any acute illness this visit. Also due for her mammogram.    Past Medical History:  Diagnosis Date  . ADD (attention deficit disorder) 05/02/2014  . Allergy   . GERD (gastroesophageal reflux disease)   . Mixed hyperlipidemia 08/20/2016  . Obesity 05/02/2014   Past Surgical History:  Procedure Laterality Date  . COSMETIC SURGERY    . GASTRIC BYPASS  2002    No Known Allergies  Outpatient  Encounter Medications as of 06/28/2019  Medication Sig  . amphetamine-dextroamphetamine (ADDERALL) 20 MG tablet Take 1 tablet (20 mg total) by mouth 2 (two) times daily. for ADD  . ibuprofen (ADVIL,MOTRIN) 200 MG tablet Take 200 mg by mouth as needed.   . Multiple Vitamin (MULTIVITAMIN) tablet Take 1 tablet by mouth daily.  Marland Kitchen OVER THE COUNTER MEDICATION Vitamin D 3, One capsule daily.  Marland Kitchen OVER THE COUNTER MEDICATION B12 vitamin tablet once daily  . Tdap (ADACEL) 11-12-13.5 LF-MCG/0.5 injection Inject 0.5 mLs into the muscle once.   No facility-administered encounter medications on file as of 06/28/2019.    Review of Systems  Constitutional: Negative for appetite change, chills, fatigue and fever.  HENT: Negative for congestion, rhinorrhea, sinus pressure, sinus pain, sneezing and sore throat.   Eyes: Negative for pain, discharge, redness and itching.  Respiratory: Negative for cough, chest tightness, shortness of breath and wheezing.   Cardiovascular: Negative for chest pain, palpitations and leg swelling.  Gastrointestinal: Negative for abdominal distention, abdominal pain, constipation, diarrhea, nausea and vomiting.  Musculoskeletal: Negative for arthralgias, back pain and gait problem.  Skin: Negative for color change, pallor and rash.  Neurological: Negative for dizziness, speech difficulty, weakness, light-headedness, numbness and headaches.  Psychiatric/Behavioral: Negative for agitation, confusion and sleep disturbance. The patient is not nervous/anxious.     Immunization History  Administered Date(s) Administered  . Influenza,inj,Quad PF,6+ Mos 05/11/2015, 08/20/2016, 05/21/2017, 06/09/2018, 06/28/2019  . Tdap 07/14/2008   Pertinent  Health Maintenance Due  Topic Date Due  . MAMMOGRAM  12/13/2011  .  COLONOSCOPY  02/13/2020 (Originally 12/13/2011)  . PAP SMEAR-Modifier  02/21/2022  . INFLUENZA VACCINE  Completed   Fall Risk  01/04/2019 06/09/2018 01/16/2017 08/20/2016 11/21/2015    Falls in the past year? 0 0 No No No  Number falls in past yr: 0 - - - -  Injury with Fall? 0 - - - -       Vitals:   06/28/19 0837  BP: 110/80  Pulse: 87  Temp: 97.7 F (36.5 C)  TempSrc: Temporal  SpO2: 98%  Weight: 266 lb 12.8 oz (121 kg)  Height: 5\' 10"  (1.778 m)   Body mass index is 38.28 kg/m. Physical Exam Vitals reviewed.  Constitutional:      General: She is not in acute distress.    Appearance: She is normal weight. She is not ill-appearing.  HENT:     Head: Normocephalic.  Eyes:     General: No scleral icterus.       Right eye: No discharge.        Left eye: No discharge.     Conjunctiva/sclera: Conjunctivae normal.     Pupils: Pupils are equal, round, and reactive to light.  Cardiovascular:     Rate and Rhythm: Normal rate and regular rhythm.     Pulses: Normal pulses.     Heart sounds: Normal heart sounds. No murmur. No friction rub. No gallop.   Pulmonary:     Effort: Pulmonary effort is normal. No respiratory distress.     Breath sounds: Normal breath sounds. No wheezing, rhonchi or rales.  Chest:     Chest wall: No tenderness.  Abdominal:     General: Bowel sounds are normal. There is no distension.     Palpations: Abdomen is soft. There is no mass.     Tenderness: There is no abdominal tenderness. There is no right CVA tenderness, left CVA tenderness, guarding or rebound.  Musculoskeletal:        General: No swelling or tenderness. Normal range of motion.     Right lower leg: No edema.     Left lower leg: No edema.  Skin:    General: Skin is warm and dry.     Coloration: Skin is not pale.     Findings: No bruising or erythema.  Neurological:     Mental Status: She is oriented to person, place, and time.     Cranial Nerves: No cranial nerve deficit.     Sensory: No sensory deficit.     Motor: No weakness.     Gait: Gait normal.  Psychiatric:        Mood and Affect: Mood normal.        Behavior: Behavior normal.        Thought Content:  Thought content normal.        Judgment: Judgment normal.    Labs reviewed: Recent Labs    01/18/19 0827  NA 138  K 4.4  CL 104  CO2 26  GLUCOSE 107*  BUN 15  CREATININE 0.82  CALCIUM 9.1   Recent Labs    01/18/19 0827  AST 17  ALT 17  BILITOT 0.5  PROT 6.8   No results for input(s): WBC, NEUTROABS, HGB, HCT, MCV, PLT in the last 8760 hours. Lab Results  Component Value Date   TSH 2.51 01/18/2019   Lab Results  Component Value Date   HGBA1C 5.4 01/18/2019   Lab Results  Component Value Date   CHOL 180 01/18/2019   HDL 43 (  L) 01/18/2019   LDLCALC 118 (H) 01/18/2019   TRIG 90 01/18/2019   CHOLHDL 4.2 01/18/2019    Significant Diagnostic Results in last 30 days:  No results found.  Assessment/Plan  1. Need for influenza vaccination Afebrile.reports no signs or symptoms of upper respiratory infections.  - Flu Vaccine QUAD 6+ mos PF IM (Fluarix Quad PF) administered by Ruthell Rummage CMA   2. Attention deficit disorder (ADD) without hyperactivity Reports need to concentrated at her job which requires high concentration.Adderrall dose was decreased by previous PCP request higher dosage but currently on maximum dosage.will continue on current Adderall 20 mg tablet twice daily.Not due for refill. Use of Narcotic/stumlant contracted signed by patient this visit. Recommend Psychiatry referral for evaluation.Telephone numbers for Psychiatry service given during visit patient to call and make appointment.  - Pain Mgmt, Profile 1 w/o Conf, U  3. Breast cancer screening by mammogram Due for mammogram screening .No acute breast issues.  - MM Digital Screening; Future  4. Need for Tdap vaccination Afebrile.due for Tdap. Vaccine order send to pharmacy.  - Tdap (ADACEL) 11-12-13.5 LF-MCG/0.5 injection; Inject 0.5 mLs into the muscle once for 1 dose.  Dispense: 0.5 mL; Refill: 0  5. Encounter for hepatitis C screening test for low risk patient No history of Hep C - Hep C  Antibody; Future   Family/ staff Communication: Reviewed plan of care with patient.  Labs/tests ordered: - MM Digital Screening; Future  Sandrea Hughs, NP

## 2019-06-30 LAB — PAIN MGMT, PROFILE 1 W/O CONF, U
Amphetamines: POSITIVE ng/mL
Barbiturates: NEGATIVE ng/mL
Benzodiazepines: NEGATIVE ng/mL
Cocaine Metabolite: NEGATIVE ng/mL
Creatinine: 99.2 mg/dL
Marijuana Metabolite: NEGATIVE ng/mL
Methadone Metabolite: NEGATIVE ng/mL
Opiates: NEGATIVE ng/mL
Oxidant: NEGATIVE ug/mL
Oxycodone: NEGATIVE ng/mL
Phencyclidine: NEGATIVE ng/mL
pH: 6.4 (ref 4.5–9.0)

## 2019-07-12 ENCOUNTER — Other Ambulatory Visit: Payer: Self-pay

## 2019-07-12 DIAGNOSIS — F988 Other specified behavioral and emotional disorders with onset usually occurring in childhood and adolescence: Secondary | ICD-10-CM

## 2019-07-12 MED ORDER — AMPHETAMINE-DEXTROAMPHETAMINE 20 MG PO TABS: 20.0000 mg | ORAL_TABLET | Freq: Two times a day (BID) | ORAL | 0 refills | Status: DC

## 2019-07-12 NOTE — Telephone Encounter (Signed)
RX last filled in Epic on 06/13/2019  Contract on file from 06/28/2019

## 2019-08-12 ENCOUNTER — Other Ambulatory Visit: Payer: Self-pay | Admitting: Nurse Practitioner

## 2019-08-12 DIAGNOSIS — F988 Other specified behavioral and emotional disorders with onset usually occurring in childhood and adolescence: Secondary | ICD-10-CM

## 2019-08-12 MED ORDER — AMPHETAMINE-DEXTROAMPHETAMINE 20 MG PO TABS: 20.0000 mg | ORAL_TABLET | Freq: Two times a day (BID) | ORAL | 0 refills | Status: DC

## 2019-09-12 ENCOUNTER — Other Ambulatory Visit: Payer: Self-pay

## 2019-09-12 DIAGNOSIS — F988 Other specified behavioral and emotional disorders with onset usually occurring in childhood and adolescence: Secondary | ICD-10-CM

## 2019-09-12 MED ORDER — AMPHETAMINE-DEXTROAMPHETAMINE 20 MG PO TABS: 20.0000 mg | ORAL_TABLET | Freq: Two times a day (BID) | ORAL | 0 refills | Status: DC

## 2019-09-12 NOTE — Telephone Encounter (Signed)
Patient medication refill was sent from [facility]. Patient has Opioid contract on file and has had recent office visit. Patient medication was pend and sent to provider.

## 2019-10-12 ENCOUNTER — Other Ambulatory Visit: Payer: Self-pay

## 2019-10-12 DIAGNOSIS — F988 Other specified behavioral and emotional disorders with onset usually occurring in childhood and adolescence: Secondary | ICD-10-CM

## 2019-10-12 MED ORDER — AMPHETAMINE-DEXTROAMPHETAMINE 20 MG PO TABS: 20.0000 mg | ORAL_TABLET | Freq: Two times a day (BID) | ORAL | 0 refills | Status: DC

## 2019-11-11 ENCOUNTER — Other Ambulatory Visit: Payer: Self-pay

## 2019-11-11 DIAGNOSIS — F988 Other specified behavioral and emotional disorders with onset usually occurring in childhood and adolescence: Secondary | ICD-10-CM

## 2019-11-11 MED ORDER — AMPHETAMINE-DEXTROAMPHETAMINE 20 MG PO TABS: 20.0000 mg | ORAL_TABLET | Freq: Two times a day (BID) | ORAL | 0 refills | Status: DC

## 2019-11-11 NOTE — Telephone Encounter (Signed)
Received Escribe from [facility] and sent to [facility] for approval.

## 2019-12-12 ENCOUNTER — Other Ambulatory Visit: Payer: Self-pay

## 2019-12-12 DIAGNOSIS — F988 Other specified behavioral and emotional disorders with onset usually occurring in childhood and adolescence: Secondary | ICD-10-CM

## 2019-12-13 MED ORDER — AMPHETAMINE-DEXTROAMPHETAMINE 20 MG PO TABS: 20.0000 mg | ORAL_TABLET | Freq: Two times a day (BID) | ORAL | 0 refills | Status: DC

## 2019-12-13 NOTE — Telephone Encounter (Signed)
Patient requesting refill on Adderall. Last refill 11/11/19.

## 2020-01-10 ENCOUNTER — Other Ambulatory Visit: Payer: Self-pay

## 2020-01-10 DIAGNOSIS — F988 Other specified behavioral and emotional disorders with onset usually occurring in childhood and adolescence: Secondary | ICD-10-CM

## 2020-01-10 MED ORDER — AMPHETAMINE-DEXTROAMPHETAMINE 20 MG PO TABS: 20.0000 mg | ORAL_TABLET | Freq: Two times a day (BID) | ORAL | 0 refills | Status: DC

## 2020-01-10 NOTE — Telephone Encounter (Signed)
Last filled in Epic on 12/13/2019  Treatment agreement on file

## 2020-02-13 ENCOUNTER — Other Ambulatory Visit: Payer: Self-pay

## 2020-02-13 DIAGNOSIS — F988 Other specified behavioral and emotional disorders with onset usually occurring in childhood and adolescence: Secondary | ICD-10-CM

## 2020-02-13 MED ORDER — AMPHETAMINE-DEXTROAMPHETAMINE 20 MG PO TABS: 20.0000 mg | ORAL_TABLET | Freq: Two times a day (BID) | ORAL | 0 refills | Status: DC

## 2020-02-13 NOTE — Telephone Encounter (Signed)
Will need to sign Narcotic contract on upcoming visit 02/28/2020 before next medication refill.

## 2020-02-13 NOTE — Telephone Encounter (Addendum)
Patient is requesting refill on "Amphetamine 20mg ". Patient has Non Opioid Contract on file 07/04/2019. Patient has upcoming appointment 02/28/2020. Medication pend and sent to PCP [place], Nelda Bucks, NP. Please Advise.

## 2020-02-27 ENCOUNTER — Encounter: Payer: BC Managed Care – PPO | Admitting: Family

## 2020-02-28 ENCOUNTER — Ambulatory Visit (INDEPENDENT_AMBULATORY_CARE_PROVIDER_SITE_OTHER): Payer: 59 | Admitting: Family

## 2020-02-28 ENCOUNTER — Encounter: Payer: Self-pay | Admitting: Family

## 2020-02-28 ENCOUNTER — Other Ambulatory Visit: Payer: Self-pay

## 2020-02-28 VITALS — BP 126/78 | HR 91 | Temp 96.9°F | Resp 18 | Ht 70.0 in | Wt 280.2 lb

## 2020-02-28 DIAGNOSIS — Z1159 Encounter for screening for other viral diseases: Secondary | ICD-10-CM

## 2020-02-28 DIAGNOSIS — Z0000 Encounter for general adult medical examination without abnormal findings: Secondary | ICD-10-CM | POA: Diagnosis not present

## 2020-02-28 DIAGNOSIS — R739 Hyperglycemia, unspecified: Secondary | ICD-10-CM

## 2020-02-28 DIAGNOSIS — Z23 Encounter for immunization: Secondary | ICD-10-CM | POA: Diagnosis not present

## 2020-02-28 DIAGNOSIS — L989 Disorder of the skin and subcutaneous tissue, unspecified: Secondary | ICD-10-CM

## 2020-02-28 DIAGNOSIS — Z113 Encounter for screening for infections with a predominantly sexual mode of transmission: Secondary | ICD-10-CM | POA: Diagnosis not present

## 2020-02-28 MED ORDER — TETANUS-DIPHTH-ACELL PERTUSSIS 5-2.5-18.5 LF-MCG/0.5 IM SUSP: 0.5000 mL | Freq: Once | INTRAMUSCULAR | 0 refills | Status: AC

## 2020-02-28 NOTE — Progress Notes (Signed)
Provider: Marlowe Sax FNP-C   Tuwanna Krausz, Nelda Bucks, NP  Patient Care Team: Keyvon Herter, Nelda Bucks, NP as PCP - General (Family Medicine)  Extended Emergency Contact Information Primary Emergency Contact: McDough,Peg Address: [place] [address] [place] Phone: [phone] Relation: Sister  Code Status:  Full cod e Goals of care: Advanced Directive information Advanced Directives 02/28/2020  Does Patient Have a Medical Advance Directive? No  Would patient like information on creating a medical advance directive? No - Patient declined     Chief Complaint  Patient presents with  . Annual Exam    Physical.  . Health Maintenance    Discuss the need for Mammogram, Hepatitis C Screening, Colonoscopy, and HIV Screening.   . Immunizations    Discuss the need for Tetanus Vaccine, and Influenza Vaccine.     HPI:  Pt is a 59 y.o. female seen today for annual physical examination and medical management of chronic diseases.she states has a mole on her right upper back would like referral to dermatologist otherwise no acute issues. She is due for Tdap vaccine.Influenza vaccine when available.  Has not been able to get her colonoscopy done waiting for a family member to come to visit so that she can pick her up after procedure. Her pap smear will be done by gynecology need referral today.   Past Medical History:  Diagnosis Date  . ADD (attention deficit disorder) 05/02/2014  . Allergy   . GERD (gastroesophageal reflux disease)   . Mixed hyperlipidemia 08/20/2016  . Obesity 05/02/2014   Past Surgical History:  Procedure Laterality Date  . COSMETIC SURGERY    . GASTRIC BYPASS  2002    No Known Allergies  Allergies as of 02/28/2020   No Known Allergies     Medication List       Accurate as of February 28, 2020  9:00 AM. If you have any questions, ask your nurse or doctor.        amphetamine-dextroamphetamine 20 MG tablet Commonly known  as: ADDERALL Take 1 tablet (20 mg total) by mouth 2 (two) times daily.   ibuprofen 200 MG tablet Commonly known as: ADVIL Take 200 mg by mouth as needed.   multivitamin tablet Take 1 tablet by mouth daily.   OVER THE COUNTER MEDICATION Vitamin D 3, One capsule daily.   OVER THE COUNTER MEDICATION B12 vitamin tablet once daily       Review of Systems  Constitutional: Negative for appetite change, chills, fatigue and fever.  HENT: Negative for congestion, rhinorrhea, sinus pressure, sinus pain, sneezing, sore throat and trouble swallowing.   Eyes: Positive for visual disturbance. Negative for pain, discharge, redness and itching.       Wears eye glasses   Respiratory: Negative for cough, chest tightness, shortness of breath and wheezing.   Cardiovascular: Negative for chest pain, palpitations and leg swelling.  Gastrointestinal: Negative for abdominal distention, abdominal pain, constipation, diarrhea, nausea and vomiting.  Endocrine: Negative for cold intolerance, heat intolerance, polydipsia, polyphagia and polyuria.  Genitourinary: Negative for difficulty urinating, dysuria, flank pain, frequency, urgency, vaginal bleeding and vaginal discharge.  Musculoskeletal: Negative for arthralgias, back pain and gait problem.  Skin: Negative for color change, pallor and rash.       Mole on upper back   Neurological: Negative for dizziness, seizures, speech difficulty, weakness, light-headedness, numbness and headaches.  Hematological: Does not bruise/bleed easily.  Psychiatric/Behavioral: Negative for agitation, behavioral  problems and sleep disturbance. The patient is not nervous/anxious.     Immunization History  Administered Date(s) Administered  . Influenza,inj,Quad PF,6+ Mos 05/11/2015, 08/20/2016, 05/21/2017, 06/09/2018, 06/28/2019  . Moderna SARS-COVID-2 Vaccination 10/14/2019, 11/11/2019  . Tdap 07/14/2008   Pertinent  Health Maintenance Due  Topic Date Due  . MAMMOGRAM   12/13/2011  . COLONOSCOPY  Never done  . INFLUENZA VACCINE  02/12/2020  . PAP SMEAR-Modifier  02/21/2022   Fall Risk  02/28/2020 01/04/2019 06/09/2018 01/16/2017 08/20/2016  Falls in the past year? 0 0 0 No No  Number falls in past yr: 0 0 - - -  Injury with Fall? 0 0 - - -    Vitals:   02/28/20 0835  BP: 126/78  Pulse: 91  Resp: 18  Temp: (!) 96.9 F (36.1 C)  SpO2: 97%  Weight: 280 lb 3.2 oz (127.1 kg)  Height: 5\' 10"  (1.778 m)   Body mass index is 40.2 kg/m. Physical Exam Vitals reviewed.  Constitutional:      General: She is not in acute distress.    Appearance: She is obese. She is not ill-appearing.  HENT:     Head: Normocephalic.     Right Ear: Tympanic membrane, ear canal and external ear normal. There is no impacted cerumen.     Left Ear: Tympanic membrane, ear canal and external ear normal. There is no impacted cerumen.     Nose: Nose normal. No congestion or rhinorrhea.     Mouth/Throat:     Mouth: Mucous membranes are moist.     Pharynx: Oropharynx is clear. No oropharyngeal exudate or posterior oropharyngeal erythema.  Eyes:     General: No scleral icterus.       Right eye: No discharge.        Left eye: No discharge.     Extraocular Movements: Extraocular movements intact.     Conjunctiva/sclera: Conjunctivae normal.     Pupils: Pupils are equal, round, and reactive to light.  Neck:     Vascular: No carotid bruit.  Cardiovascular:     Rate and Rhythm: Normal rate and regular rhythm.     Pulses: Normal pulses.     Heart sounds: Normal heart sounds. No murmur heard.  No friction rub. No gallop.   Pulmonary:     Effort: Pulmonary effort is normal. No respiratory distress.     Breath sounds: Normal breath sounds. No wheezing, rhonchi or rales.  Chest:     Chest wall: No tenderness.  Abdominal:     General: Bowel sounds are normal. There is no distension.     Palpations: Abdomen is soft. There is no mass.     Tenderness: There is no abdominal tenderness.  There is no right CVA tenderness, left CVA tenderness, guarding or rebound.  Musculoskeletal:        General: No swelling or tenderness. Normal range of motion.     Cervical back: Normal range of motion. No rigidity or tenderness.     Right lower leg: No edema.     Left lower leg: No edema.  Lymphadenopathy:     Cervical: No cervical adenopathy.  Skin:    General: Skin is warm.     Coloration: Skin is not pale.     Findings: No bruising, erythema or rash.     Comments: Right upper back lesion with different shades of color,oval shaped, rough texture measuring 0.5 X 0.4 cm none tender to touch and without any erythema or drainage. Surrounding skin  tissue intact.   Neurological:     Mental Status: She is alert and oriented to person, place, and time.     Cranial Nerves: No cranial nerve deficit.     Sensory: No sensory deficit.     Motor: No weakness.     Coordination: Coordination normal.     Gait: Gait normal.     Deep Tendon Reflexes: Reflexes normal.  Psychiatric:        Mood and Affect: Mood normal.        Behavior: Behavior normal.        Thought Content: Thought content normal.        Judgment: Judgment normal.    Labs reviewed:  Lab Results  Component Value Date   TSH 2.51 01/18/2019   Lab Results  Component Value Date   HGBA1C 5.4 01/18/2019   Lab Results  Component Value Date   CHOL 180 01/18/2019   HDL 43 (L) 01/18/2019   LDLCALC 118 (H) 01/18/2019   TRIG 90 01/18/2019   CHOLHDL 4.2 01/18/2019    Significant Diagnostic Results in last 30 days:  No results found.  Assessment/Plan 1. Routine general medical examination at a health care facility Advised to get Tdap Vaccine at her pharmacy.Influenza vaccine when available. Medication and labs reviewed patient counselled regarding yearly exam, prevention of dental and periodontal disease, diet, regular sustained exercise for at least 30 minutes x 3 /week,COVID-19 hand hygiene, mask and social distancing per CDC  guidelines. Proper use of sun screen and protective clothing, recommended schedule for routine labs. - CBC with Differential - Comprehensive metabolic panel - Lipid panel - TSH - Hepatitis C antibody screen - HIV antibody - Ambulatory referral to Gynecology for pap smear and breast exam per pt's request .  2. Need for Tdap vaccination Script send to pharmacy.Advised to get vaccine at her pharmacy.  3. Encounter for hepatitis C screening test for low risk patient Asymptomatic. - Hepatitis C antibody screen  4. Screening examination for venereal disease No high risk behaviors reported. - HIV antibody  5. Skin lesion of back Right upper back lesion with different shades of color,oval shaped, rough texture measuring 0.5 X 0.4 cm none tender to touch and without any erythema or drainage. Surrounding skin tissue intact. - Ambulatory referral to Dermatology for evaluation of right upper back skin lesion.   Family/ staff Communication: Reviewed plan of care with patient verbalized understanding.   Labs/tests ordered:  - CBC with Differential - Comprehensive metabolic panel - Lipid panel - TSH - Hepatitis C antibody screen - HIV antibody Next Appointment : 1 year for Annual Physical Exam.   Sandrea Hughs, NP

## 2020-03-07 LAB — HEPATITIS C ANTIBODY
Hepatitis C Ab: NONREACTIVE
SIGNAL TO CUT-OFF: 0.01 (ref <1.00)

## 2020-03-07 LAB — CBC WITH DIFFERENTIAL/PLATELET
Absolute Monocytes: 380 cells/uL (ref 200–950)
Basophils Absolute: 42 cells/uL (ref 0–200)
Basophils Relative: 0.8 %
Eosinophils Absolute: 109 cells/uL (ref 15–500)
Eosinophils Relative: 2.1 %
HCT: 45.7 % — ABNORMAL HIGH (ref 35.0–45.0)
Hemoglobin: 15.4 g/dL (ref 11.7–15.5)
Lymphs Abs: 1451 cells/uL (ref 850–3900)
MCH: 30.1 pg (ref 27.0–33.0)
MCHC: 33.7 g/dL (ref 32.0–36.0)
MCV: 89.3 fL (ref 80.0–100.0)
MPV: 9.7 fL (ref 7.5–12.5)
Monocytes Relative: 7.3 %
Neutro Abs: 3219 cells/uL (ref 1500–7800)
Neutrophils Relative %: 61.9 %
Platelets: 259 10*3/uL (ref 140–400)
RBC: 5.12 10*6/uL — ABNORMAL HIGH (ref 3.80–5.10)
RDW: 13.1 % (ref 11.0–15.0)
Total Lymphocyte: 27.9 %
WBC: 5.2 10*3/uL (ref 3.8–10.8)

## 2020-03-07 LAB — COMPREHENSIVE METABOLIC PANEL
AG Ratio: 1.6 (calc) (ref 1.0–2.5)
ALT: 19 U/L (ref 6–29)
AST: 16 U/L (ref 10–35)
Albumin: 4 g/dL (ref 3.6–5.1)
Alkaline phosphatase (APISO): 82 U/L (ref 37–153)
BUN: 13 mg/dL (ref 7–25)
CO2: 27 mmol/L (ref 20–32)
Calcium: 8.9 mg/dL (ref 8.6–10.4)
Chloride: 105 mmol/L (ref 98–110)
Creat: 0.66 mg/dL (ref 0.50–1.05)
Globulin: 2.5 g/dL (calc) (ref 1.9–3.7)
Glucose, Bld: 100 mg/dL — ABNORMAL HIGH (ref 65–99)
Potassium: 4.2 mmol/L (ref 3.5–5.3)
Sodium: 139 mmol/L (ref 135–146)
Total Bilirubin: 0.5 mg/dL (ref 0.2–1.2)
Total Protein: 6.5 g/dL (ref 6.1–8.1)

## 2020-03-07 LAB — LIPID PANEL
Cholesterol: 211 mg/dL — ABNORMAL HIGH (ref <200)
HDL: 54 mg/dL (ref >=50)
LDL Cholesterol (Calc): 136 mg/dL (calc) — ABNORMAL HIGH
Non-HDL Cholesterol (Calc): 157 mg/dL (calc) — ABNORMAL HIGH (ref <130)
Total CHOL/HDL Ratio: 3.9 (calc) (ref <5.0)
Triglycerides: 100 mg/dL (ref <150)

## 2020-03-07 LAB — HEMOGLOBIN A1C
Hgb A1c MFr Bld: 5 % of total Hgb (ref <5.7)
Mean Plasma Glucose: 97 (calc)
eAG (mmol/L): 5.4 (calc)

## 2020-03-07 LAB — TEST AUTHORIZATION

## 2020-03-07 LAB — HIV ANTIBODY (ROUTINE TESTING W REFLEX): HIV 1&2 Ab, 4th Generation: NONREACTIVE

## 2020-03-07 LAB — TSH: TSH: 2.43 mIU/L (ref 0.40–4.50)

## 2020-03-12 ENCOUNTER — Encounter: Payer: Self-pay | Admitting: Obstetrics and Gynecology

## 2020-03-14 ENCOUNTER — Other Ambulatory Visit: Payer: Self-pay

## 2020-03-14 DIAGNOSIS — F988 Other specified behavioral and emotional disorders with onset usually occurring in childhood and adolescence: Secondary | ICD-10-CM

## 2020-03-14 MED ORDER — AMPHETAMINE-DEXTROAMPHETAMINE 20 MG PO TABS: 20.0000 mg | ORAL_TABLET | Freq: Two times a day (BID) | ORAL | 0 refills | Status: DC

## 2020-03-14 NOTE — Telephone Encounter (Signed)
Last refilled 02/13/20.  Non-opioid contract was done 06/2019.

## 2020-04-12 ENCOUNTER — Other Ambulatory Visit: Payer: Self-pay

## 2020-04-12 DIAGNOSIS — F988 Other specified behavioral and emotional disorders with onset usually occurring in childhood and adolescence: Secondary | ICD-10-CM

## 2020-04-12 MED ORDER — AMPHETAMINE-DEXTROAMPHETAMINE 20 MG PO TABS: 20.0000 mg | ORAL_TABLET | Freq: Two times a day (BID) | ORAL | 0 refills | Status: DC

## 2020-05-11 ENCOUNTER — Other Ambulatory Visit: Payer: Self-pay

## 2020-05-11 DIAGNOSIS — F988 Other specified behavioral and emotional disorders with onset usually occurring in childhood and adolescence: Secondary | ICD-10-CM

## 2020-05-11 MED ORDER — AMPHETAMINE-DEXTROAMPHETAMINE 20 MG PO TABS: 20.0000 mg | ORAL_TABLET | Freq: Two times a day (BID) | ORAL | 0 refills | Status: DC

## 2020-05-11 NOTE — Telephone Encounter (Signed)
Sent to [employer] for approval

## 2020-06-12 ENCOUNTER — Other Ambulatory Visit: Payer: Self-pay

## 2020-06-12 DIAGNOSIS — F988 Other specified behavioral and emotional disorders with onset usually occurring in childhood and adolescence: Secondary | ICD-10-CM

## 2020-06-12 MED ORDER — AMPHETAMINE-DEXTROAMPHETAMINE 20 MG PO TABS: 20.0000 mg | ORAL_TABLET | Freq: Two times a day (BID) | ORAL | 0 refills | Status: DC

## 2020-06-12 NOTE — Telephone Encounter (Signed)
Sent to [employer] for approval

## 2020-06-13 ENCOUNTER — Ambulatory Visit: Payer: 59 | Admitting: Obstetrics and Gynecology

## 2020-07-13 ENCOUNTER — Other Ambulatory Visit: Payer: Self-pay

## 2020-07-13 DIAGNOSIS — F988 Other specified behavioral and emotional disorders with onset usually occurring in childhood and adolescence: Secondary | ICD-10-CM

## 2020-07-16 MED ORDER — AMPHETAMINE-DEXTROAMPHETAMINE 20 MG PO TABS: 20.0000 mg | ORAL_TABLET | Freq: Two times a day (BID) | ORAL | 0 refills | Status: DC

## 2020-07-16 NOTE — Telephone Encounter (Signed)
Sending to [facility] for approval

## 2020-08-13 ENCOUNTER — Other Ambulatory Visit: Payer: Self-pay

## 2020-08-13 DIAGNOSIS — F988 Other specified behavioral and emotional disorders with onset usually occurring in childhood and adolescence: Secondary | ICD-10-CM

## 2020-08-13 MED ORDER — AMPHETAMINE-DEXTROAMPHETAMINE 20 MG PO TABS
20.0000 mg | ORAL_TABLET | Freq: Two times a day (BID) | ORAL | 0 refills | Status: DC
Start: 2020-08-13 — End: 2020-09-11

## 2020-08-13 NOTE — Telephone Encounter (Signed)
Sent to [facility] for approval  

## 2020-09-11 ENCOUNTER — Other Ambulatory Visit: Payer: Self-pay

## 2020-09-11 DIAGNOSIS — F988 Other specified behavioral and emotional disorders with onset usually occurring in childhood and adolescence: Secondary | ICD-10-CM

## 2020-09-11 MED ORDER — AMPHETAMINE-DEXTROAMPHETAMINE 20 MG PO TABS: 20.0000 mg | ORAL_TABLET | Freq: Two times a day (BID) | ORAL | 0 refills | Status: DC

## 2020-09-11 NOTE — Telephone Encounter (Signed)
Please schedule appointment to renew control substance contract prior to next Adderall refill.

## 2020-09-11 NOTE — Telephone Encounter (Signed)
RX last filled on 08/13/2020  Treatment agreement on file from 06/28/2019  No pending appointment scheduled

## 2020-09-12 NOTE — Telephone Encounter (Signed)
Mychart message sent to patient reminding her to schedule an appointment prior to next refill as indicated by Ngetich, Nelda Bucks, NP

## 2020-09-17 ENCOUNTER — Other Ambulatory Visit (HOSPITAL_COMMUNITY)
Admission: RE | Admit: 2020-09-17 | Discharge: 2020-09-17 | Disposition: A | Discharge status: Home / Self Care | Payer: BC Managed Care – PPO | Source: Ambulatory Visit | Attending: Obstetrics and Gynecology | Admitting: Obstetrics and Gynecology

## 2020-09-17 ENCOUNTER — Encounter: Payer: Self-pay | Admitting: Obstetrics and Gynecology

## 2020-09-17 ENCOUNTER — Ambulatory Visit (INDEPENDENT_AMBULATORY_CARE_PROVIDER_SITE_OTHER): Payer: 59 | Admitting: Obstetrics and Gynecology

## 2020-09-17 ENCOUNTER — Other Ambulatory Visit: Payer: Self-pay

## 2020-09-17 VITALS — BP 130/62 | HR 96 | Ht 70.0 in | Wt 296.0 lb

## 2020-09-17 DIAGNOSIS — Z01419 Encounter for gynecological examination (general) (routine) without abnormal findings: Secondary | ICD-10-CM

## 2020-09-17 DIAGNOSIS — Z124 Encounter for screening for malignant neoplasm of cervix: Secondary | ICD-10-CM

## 2020-09-17 DIAGNOSIS — Z803 Family history of malignant neoplasm of breast: Secondary | ICD-10-CM | POA: Diagnosis not present

## 2020-09-17 DIAGNOSIS — Z6841 Body Mass Index (BMI) 40.0 and over, adult: Secondary | ICD-10-CM

## 2020-09-17 DIAGNOSIS — Z23 Encounter for immunization: Secondary | ICD-10-CM

## 2020-09-17 NOTE — Patient Instructions (Signed)
You can try Uberlube for lubricant as needed.  EXERCISE   We recommended that you start or continue a regular exercise program for good health. Physical activity is anything that gets your body moving, some is better than none. The CDC recommends 150 minutes per week of Moderate-Intensity Aerobic Activity and 2 or more days of Muscle Strengthening Activity.  Benefits of exercise are limitless: helps weight loss/weight maintenance, improves mood and energy, helps with depression and anxiety, improves sleep, tones and strengthens muscles, improves balance, improves bone density, protects from chronic conditions such as heart disease, high blood pressure and diabetes and so much more. To learn more visit: WhyNotPoker.uy  DIET: Good nutrition starts with a healthy diet of fruits, vegetables, whole grains, and lean protein sources. Drink plenty of water for hydration. Minimize empty calories, sodium, sweets. For more information about dietary recommendations visit: GeekRegister.com.ee and http://schaefer-mitchell.com/  ALCOHOL:  Women should limit their alcohol intake to no more than 7 drinks/beers/glasses of wine (combined, not each!) per week. Moderation of alcohol intake to this level decreases your risk of breast cancer and liver damage.  If you are concerned that you may have a problem, or your friends have told you they are concerned about your drinking, there are many resources to help. A well-known program that is free, effective, and available to all people all over the nation is Alcoholics Anonymous.  Check out this site to learn more: BlockTaxes.se   CALCIUM AND VITAMIN D:  Adequate intake of calcium and Vitamin D are recommended for bone health.  You should be getting between 1000-1200 mg of calcium and 800 units of Vitamin D daily between diet and supplements  PAP SMEARS:  Pap smears, to check for cervical  cancer or precancers,  have traditionally been done yearly, scientific advances have shown that most women can have pap smears less often.  However, every woman still should have a physical exam from her gynecologist every year. It will include a breast check, inspection of the vulva and vagina to check for abnormal growths or skin changes, a visual exam of the cervix, and then an exam to evaluate the size and shape of the uterus and ovaries. We will also provide age appropriate advice regarding health maintenance, like when you should have certain vaccines, screening for sexually transmitted diseases, bone density testing, colonoscopy, mammograms, etc.   MAMMOGRAMS:  All women over 33 years old should have a routine mammogram.   COLON CANCER SCREENING: Now recommend starting at age 8. At this time colonoscopy is not covered for routine screening until 50. There are take home tests that can be done between 45-49.   COLONOSCOPY:  Colonoscopy to screen for colon cancer is recommended for all women at age 36.  We know, you hate the idea of the prep.  We agree, BUT, having colon cancer and not knowing it is worse!!  Colon cancer so often starts as a polyp that can be seen and removed at colonscopy, which can quite literally save your life!  And if your first colonoscopy is normal and you have no family history of colon cancer, most women don't have to have it again for 10 years.  Once every ten years, you can do something that may end up saving your life, right?  We will be happy to help you get it scheduled when you are ready.  Be sure to check your insurance coverage so you understand how much it will cost.  It may be covered as a  preventative service at no cost, but you should check your particular policy.      Breast Self-Awareness Breast self-awareness means being familiar with how your breasts look and feel. It involves checking your breasts regularly and reporting any changes to your health care  provider. Practicing breast self-awareness is important. A change in your breasts can be a sign of a serious medical problem. Being familiar with how your breasts look and feel allows you to find any problems early, when treatment is more likely to be successful. All women should practice breast self-awareness, including women who have had breast implants. How to do a breast self-exam One way to learn what is normal for your breasts and whether your breasts are changing is to do a breast self-exam. To do a breast self-exam: Look for Changes  1. Remove all the clothing above your waist. 2. Stand in front of a mirror in a room with good lighting. 3. Put your hands on your hips. 4. Push your hands firmly downward. 5. Compare your breasts in the mirror. Look for differences between them (asymmetry), such as: ? Differences in shape. ? Differences in size. ? Puckers, dips, and bumps in one breast and not the other. 6. Look at each breast for changes in your skin, such as: ? Redness. ? Scaly areas. 7. Look for changes in your nipples, such as: ? Discharge. ? Bleeding. ? Dimpling. ? Redness. ? A change in position. Feel for Changes Carefully feel your breasts for lumps and changes. It is best to do this while lying on your back on the floor and again while sitting or standing in the shower or tub with soapy water on your skin. Feel each breast in the following way:  Place the arm on the side of the breast you are examining above your head.  Feel your breast with the other hand.  Start in the nipple area and make  inch (2 cm) overlapping circles to feel your breast. Use the pads of your three middle fingers to do this. Apply light pressure, then medium pressure, then firm pressure. The light pressure will allow you to feel the tissue closest to the skin. The medium pressure will allow you to feel the tissue that is a little deeper. The firm pressure will allow you to feel the tissue close to  the ribs.  Continue the overlapping circles, moving downward over the breast until you feel your ribs below your breast.  Move one finger-width toward the center of the body. Continue to use the  inch (2 cm) overlapping circles to feel your breast as you move slowly up toward your collarbone.  Continue the up and down exam using all three pressures until you reach your armpit.  Write Down What You Find  Write down what is normal for each breast and any changes that you find. Keep a written record with breast changes or normal findings for each breast. By writing this information down, you do not need to depend only on memory for size, tenderness, or location. Write down where you are in your menstrual cycle, if you are still menstruating. If you are having trouble noticing differences in your breasts, do not get discouraged. With time you will become more familiar with the variations in your breasts and more comfortable with the exam. How often should I examine my breasts? Examine your breasts every month. If you are breastfeeding, the best time to examine your breasts is after a feeding or after using a  breast pump. If you menstruate, the best time to examine your breasts is 5-7 days after your period is over. During your period, your breasts are lumpier, and it may be more difficult to notice changes. When should I see my health care provider? See your health care provider if you notice:  A change in shape or size of your breasts or nipples.  A change in the skin of your breast or nipples, such as a reddened or scaly area.  Unusual discharge from your nipples.  A lump or thick area that was not there before.  Pain in your breasts.  Anything that concerns you.

## 2020-09-17 NOTE — Progress Notes (Signed)
60 y.o. [id number] Divorced Unavailable Not Hispanic or Latino female here for annual exam.  No vaginal bleeding. Single, not currently sexually active. No h/o dyspareunia.   No bowel or bladder issues.     No LMP recorded. Patient is postmenopausal.          Sexually active: No.  The current method of family planning is post menopausal status.    Exercising: Yes.    walking Smoker:  no  Health Maintenance: Pap:  02/22/2019 WNL 05/11/15 ASCUS, no hpv testing was done.  History of abnormal Pap:  no MMG:  5 years ago normal per patient   BMD:   None  Colonoscopy: none, she will schedule  TDaP:  2010  Gardasil:  None    reports that she has never smoked. She has never used smokeless tobacco. She reports current alcohol use. She reports that she does not use drugs. 1-2 bottles of wine a week. She works at [employer], she takes care of the Psychologist, prison and probation services. Loves her job. Kids are 97 and 28. Daughter lives in [place] is a Risk manager woman. Son lives in [place]. . No grand kids.   She has a h/o a gastric bypass. Was close to 400 lbs prior to the bypass. She has been as low as the low 200's.   Past Medical History:  Diagnosis Date  . ADD (attention deficit disorder) 05/02/2014  . Allergy   . GERD (gastroesophageal reflux disease)   . Mixed hyperlipidemia 08/20/2016  . Obesity 05/02/2014    Past Surgical History:  Procedure Laterality Date  . COSMETIC SURGERY    . GASTRIC BYPASS  2002    Current Outpatient Medications  Medication Sig Dispense Refill  . amphetamine-dextroamphetamine (ADDERALL) 20 MG tablet Take 1 tablet (20 mg total) by mouth 2 (two) times daily. 60 tablet 0  . ibuprofen (ADVIL,MOTRIN) 200 MG tablet Take 200 mg by mouth as needed.     . Multiple Vitamin (MULTIVITAMIN) tablet Take 1 tablet by mouth daily.    Marland Kitchen OVER THE COUNTER MEDICATION Vitamin D 3, One capsule daily.    Marland Kitchen OVER THE COUNTER MEDICATION B12 vitamin tablet once daily     No current facility-administered  medications for this visit.    Family History  Problem Relation Age of Onset  . Cancer Father   . Breast cancer Mother   . Stroke Mother   . Breast cancer Maternal Grandmother   . Breast cancer Paternal Grandmother   . Asthma Brother   . Colon cancer Neg Hx   . Esophageal cancer Neg Hx   . Rectal cancer Neg Hx   . Stomach cancer Neg Hx   Unsure what type of cancer her dad had, died at 48. He was a life long smoker. Mom had breast cancer in her 83's MGM breast cancer in her 3's 1 Maternal 1st cousin with breast cancer (1/30 cousins), she was in her 70's.  PGM breast cancer in her 50's  Review of Systems  All other systems reviewed and are negative.   Exam:   BP 130/62   Pulse 96   Ht 5\' 10"  (1.778 m)   Wt 296 lb (134.3 kg)   SpO2 98%   BMI 42.47 kg/m   Weight change: @WEIGHTCHANGE @ Height:   Height: 5\' 10"  (177.8 cm)  Ht Readings from Last 3 Encounters:  09/17/20 5\' 10"  (1.778 m)  02/28/20 5\' 10"  (1.778 m)  06/28/19 5\' 10"  (1.778 m)    General appearance: alert, cooperative  and appears stated age Head: Normocephalic, without obvious abnormality, atraumatic Neck: no adenopathy, supple, symmetrical, trachea midline and thyroid normal to inspection and palpation Lungs: clear to auscultation bilaterally Cardiovascular: regular rate and rhythm Breasts: normal appearance, no masses or tenderness Abdomen: soft, non-tender; non distended,  no masses,  no organomegaly Extremities: extremities normal, atraumatic, no cyanosis or edema Skin: Skin color, texture, turgor normal. No rashes or lesions Lymph nodes: Cervical, supraclavicular, and axillary nodes normal. No abnormal inguinal nodes palpated Neurologic: Grossly normal   Pelvic: External genitalia:  no lesions              Urethra:  normal appearing urethra with no masses, tenderness or lesions              Bartholins and Skenes: normal                 Vagina: normal appearing vagina with normal color and discharge,  no lesions              Cervix: no lesions               Bimanual Exam:  Uterus:  anteverted, mobile, not tender, not appreciably enlarged.               Adnexa: no mass, fullness, tenderness               Rectovaginal: Confirms               Anus:  normal sphincter tone, no lesions  Gae Dry chaperoned for the exam.  1. Well woman exam Discussed breast self exam Discussed calcium and vit D intake Mammogram # given She will schedule her colonoscopy  2. Family history of breast cancer  - Ambulatory referral to Genetics   3. Screening for cervical cancer  - Cytology - PAP with hpv  4. Body mass index (BMI) of 40.0 to 44.9 in adult ([place])   5. Immunization due  - Tdap vaccine greater than or equal to 7yo IM

## 2020-09-19 LAB — CYTOLOGY - PAP
Comment: NEGATIVE
Diagnosis: NEGATIVE
High risk HPV: NEGATIVE

## 2020-10-17 ENCOUNTER — Other Ambulatory Visit: Payer: Self-pay

## 2020-10-17 DIAGNOSIS — F988 Other specified behavioral and emotional disorders with onset usually occurring in childhood and adolescence: Secondary | ICD-10-CM

## 2020-10-17 MED ORDER — AMPHETAMINE-DEXTROAMPHETAMINE 20 MG PO TABS: 20.0000 mg | ORAL_TABLET | Freq: Two times a day (BID) | ORAL | 0 refills | Status: DC

## 2020-10-17 NOTE — Telephone Encounter (Signed)
Please schedule appointment to sign and renew contract prior to the next refill for Adderall. Also need to schedule appointment for next annual Physical exam in August,2022.

## 2020-10-18 NOTE — Telephone Encounter (Signed)
I called to try to make an appointment for patient to come in and sign her contract for controlled substance but I got no answer will try again later

## 2020-10-19 NOTE — Telephone Encounter (Signed)
Left message on machine for patient to return call when available and that I was calling because she she needed to schedule an appointment to sign a new contract per [facility] Ngetich Np or this might interfere with her next refill

## 2020-10-24 NOTE — Telephone Encounter (Signed)
I called patient to schedule appointment to come in and sign her contract for the year no answer left message on voice mail.

## 2020-10-24 NOTE — Telephone Encounter (Signed)
Letter sent to patient after 3 attempts where made to contact her.

## 2020-11-06 ENCOUNTER — Other Ambulatory Visit: Payer: Self-pay | Admitting: Family

## 2020-11-06 ENCOUNTER — Ambulatory Visit (INDEPENDENT_AMBULATORY_CARE_PROVIDER_SITE_OTHER): Payer: 59 | Admitting: Family

## 2020-11-06 VITALS — BP 110/78 | HR 89 | Temp 97.7°F | Resp 18 | Ht 70.0 in

## 2020-11-06 DIAGNOSIS — F988 Other specified behavioral and emotional disorders with onset usually occurring in childhood and adolescence: Secondary | ICD-10-CM | POA: Diagnosis not present

## 2020-11-07 ENCOUNTER — Encounter: Payer: Self-pay | Admitting: Family
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
## 2020-11-09 LAB — DRUG MONITOR, PANEL 1, W/CONF, URINE
Amphetamine: >15000 ng/mL — ABNORMAL HIGH (ref <250)
Amphetamines: POSITIVE ng/mL — AB (ref <500)
Barbiturates: NEGATIVE ng/mL (ref <300)
Benzodiazepines: NEGATIVE ng/mL (ref <100)
Cocaine Metabolite: NEGATIVE ng/mL (ref <150)
Creatinine: 70.2 mg/dL
Marijuana Metabolite: NEGATIVE ng/mL (ref <20)
Methadone Metabolite: NEGATIVE ng/mL (ref <100)
Methamphetamine: NEGATIVE ng/mL (ref <250)
Opiates: NEGATIVE ng/mL (ref <100)
Oxidant: NEGATIVE ug/mL
Oxycodone: NEGATIVE ng/mL (ref <100)
Phencyclidine: NEGATIVE ng/mL (ref <25)
pH: 5.3 (ref 4.5–9.0)

## 2020-11-09 LAB — DM TEMPLATE

## 2020-11-21 ENCOUNTER — Other Ambulatory Visit: Payer: Self-pay

## 2020-11-21 DIAGNOSIS — F988 Other specified behavioral and emotional disorders with onset usually occurring in childhood and adolescence: Secondary | ICD-10-CM

## 2020-11-21 MED ORDER — AMPHETAMINE-DEXTROAMPHETAMINE 20 MG PO TABS: 20.0000 mg | ORAL_TABLET | Freq: Two times a day (BID) | ORAL | 0 refills | Status: DC

## 2020-11-21 NOTE — Telephone Encounter (Signed)
Treatment agreement on file form 10/2020, RX last filled in epic on 10/17/2020

## 2020-12-14 ENCOUNTER — Other Ambulatory Visit: Payer: Self-pay | Admitting: Nurse Practitioner

## 2020-12-14 DIAGNOSIS — F988 Other specified behavioral and emotional disorders with onset usually occurring in childhood and adolescence: Secondary | ICD-10-CM

## 2020-12-14 MED ORDER — AMPHETAMINE-DEXTROAMPHETAMINE 20 MG PO TABS: 20.0000 mg | ORAL_TABLET | Freq: Two times a day (BID) | ORAL | 0 refills | Status: DC

## 2020-12-14 NOTE — Telephone Encounter (Signed)
Last filled in Epic on 11/21/2020, treatment agreement on file from 11/06/20

## 2021-01-15 ENCOUNTER — Other Ambulatory Visit: Payer: Self-pay | Admitting: Adult Health

## 2021-01-15 DIAGNOSIS — F988 Other specified behavioral and emotional disorders with onset usually occurring in childhood and adolescence: Secondary | ICD-10-CM

## 2021-01-15 MED ORDER — AMPHETAMINE-DEXTROAMPHETAMINE 20 MG PO TABS: 20.0000 mg | ORAL_TABLET | Freq: Two times a day (BID) | ORAL | 0 refills | Status: DC

## 2021-02-14 ENCOUNTER — Other Ambulatory Visit: Payer: Self-pay

## 2021-02-14 DIAGNOSIS — F988 Other specified behavioral and emotional disorders with onset usually occurring in childhood and adolescence: Secondary | ICD-10-CM

## 2021-02-15 ENCOUNTER — Other Ambulatory Visit: Payer: Self-pay

## 2021-02-15 DIAGNOSIS — F988 Other specified behavioral and emotional disorders with onset usually occurring in childhood and adolescence: Secondary | ICD-10-CM

## 2021-02-15 MED ORDER — AMPHETAMINE-DEXTROAMPHETAMINE 20 MG PO TABS: 20.0000 mg | ORAL_TABLET | Freq: Two times a day (BID) | ORAL | 0 refills | Status: DC

## 2021-02-15 NOTE — Telephone Encounter (Signed)
Patient has request refill on medication "Adderall". Patient medication was already filled today 02/15/2021. I'm unable to refuse this medication because it's a controlled substance. But medication is pending for you to refuse. Medication pend and sent to PCP Ngetich, Nelda Bucks, NP .

## 2021-02-15 NOTE — Telephone Encounter (Signed)
Please schedule for your annual physical examination with Fasting blood work prior to your next Adderall refill.

## 2021-03-22 ENCOUNTER — Other Ambulatory Visit: Payer: Self-pay

## 2021-03-22 DIAGNOSIS — F988 Other specified behavioral and emotional disorders with onset usually occurring in childhood and adolescence: Secondary | ICD-10-CM

## 2021-03-22 MED ORDER — AMPHETAMINE-DEXTROAMPHETAMINE 20 MG PO TABS: 20.0000 mg | ORAL_TABLET | Freq: Two times a day (BID) | ORAL | 0 refills | Status: DC

## 2021-03-22 NOTE — Telephone Encounter (Signed)
RX last refilled on 02/15/2021  Treatment agreement on file from April 2022

## 2021-04-22 ENCOUNTER — Other Ambulatory Visit: Payer: Self-pay

## 2021-04-22 DIAGNOSIS — F988 Other specified behavioral and emotional disorders with onset usually occurring in childhood and adolescence: Secondary | ICD-10-CM

## 2021-04-22 MED ORDER — AMPHETAMINE-DEXTROAMPHETAMINE 20 MG PO TABS: 20.0000 mg | ORAL_TABLET | Freq: Two times a day (BID) | ORAL | 0 refills | Status: DC

## 2021-04-22 NOTE — Telephone Encounter (Signed)
Patient has request refill on medication "Adderall". Patient last refill was 03/22/2021. Patient medication pend and sent to PCP Ngetich, Nelda Bucks, NP for approval. Please Advise.

## 2021-05-21 ENCOUNTER — Other Ambulatory Visit: Payer: Self-pay

## 2021-05-21 DIAGNOSIS — F988 Other specified behavioral and emotional disorders with onset usually occurring in childhood and adolescence: Secondary | ICD-10-CM

## 2021-05-21 MED ORDER — AMPHETAMINE-DEXTROAMPHETAMINE 20 MG PO TABS: 20.0000 mg | ORAL_TABLET | Freq: Two times a day (BID) | ORAL | 0 refills | Status: DC

## 2021-05-21 NOTE — Telephone Encounter (Signed)
Patient has request refill on medication "Adderall". Patient last refill was 04/22/2021. Patient last Non Opioid Contract was 07/04/2019. Patient updated contract mailed attached with note for patient to update. Patient was also left a voicemail as well to call office back. Medication pend and sent to PCP Ngetich, Nelda Bucks, NP for approval. Please Advise.

## 2021-05-21 NOTE — Telephone Encounter (Signed)
Please schedule your annual physical appointment with fasting lab work in April,2023

## 2021-05-21 NOTE — Telephone Encounter (Signed)
Patient mailed contract with paper attached notifying patient to sign and mail back to office as well as to call office and schedule Annual Physical with fasting labs appointment for April.

## 2021-06-19 ENCOUNTER — Other Ambulatory Visit: Payer: Self-pay | Admitting: *Deleted

## 2021-06-19 DIAGNOSIS — F988 Other specified behavioral and emotional disorders with onset usually occurring in childhood and adolescence: Secondary | ICD-10-CM

## 2021-06-19 MED ORDER — AMPHETAMINE-DEXTROAMPHETAMINE 20 MG PO TABS: 20.0000 mg | ORAL_TABLET | Freq: Two times a day (BID) | ORAL | 0 refills | Status: DC

## 2021-06-19 NOTE — Telephone Encounter (Signed)
Patient requested refill.  Epic LR: 05/21/2021 Pended Rx and sent to [facility] for approval.

## 2021-07-23 ENCOUNTER — Other Ambulatory Visit: Payer: Self-pay | Admitting: *Deleted

## 2021-07-23 DIAGNOSIS — F988 Other specified behavioral and emotional disorders with onset usually occurring in childhood and adolescence: Secondary | ICD-10-CM

## 2021-07-23 MED ORDER — AMPHETAMINE-DEXTROAMPHETAMINE 20 MG PO TABS: 20.0000 mg | ORAL_TABLET | Freq: Two times a day (BID) | ORAL | 0 refills | Status: DC

## 2021-07-23 NOTE — Telephone Encounter (Signed)
Patient requested refill.  Epic LR: 06/19/2021 Contract Date: 05/21/2021 Pended Rx and sent to [facility] for approval.

## 2021-07-30 ENCOUNTER — Other Ambulatory Visit: Payer: Self-pay | Admitting: *Deleted

## 2021-07-30 DIAGNOSIS — F988 Other specified behavioral and emotional disorders with onset usually occurring in childhood and adolescence: Secondary | ICD-10-CM

## 2021-07-30 NOTE — Telephone Encounter (Signed)
Patient called and stated that [facility] does not have her Adderall Rx in stock and requesting [facility] instead.   I called [facility] and confirmed and pharmacist stated that it is on Backorder.   Pended Rx and sent to [facility] for approval [facility].   Epic LR: 06/19/2021 Contract Date: 05/21/2021 Pended Rx and sent to [facility] for approval

## 2021-07-31 ENCOUNTER — Other Ambulatory Visit: Payer: Self-pay | Admitting: Family

## 2021-07-31 DIAGNOSIS — F988 Other specified behavioral and emotional disorders with onset usually occurring in childhood and adolescence: Secondary | ICD-10-CM

## 2021-07-31 MED ORDER — AMPHETAMINE-DEXTROAMPHETAMINE 20 MG PO TABS: 20.0000 mg | ORAL_TABLET | Freq: Two times a day (BID) | ORAL | 0 refills | Status: DC

## 2021-07-31 NOTE — Telephone Encounter (Signed)
Patient notified. Stated that she is aggravated because you didn't tell her when you initially sent the refill 1/10 to the pharmacy that she needed an appointment.  Stated that she could not help that the pharmacy did not have it to fill it and it is not like she is asking for extra, stated that she just wants her refill.   Patient scheduled an appointment for 08/06/2021 to follow up but needs her Rx. Stated that she is already late on it anyways.

## 2021-07-31 NOTE — Telephone Encounter (Signed)
No appointment was schedule on previous visit per After visit summary.

## 2021-07-31 NOTE — Telephone Encounter (Signed)
Please schedule follow up appointment prior to refilling Adderall.No appointment on chart.

## 2021-08-06 ENCOUNTER — Ambulatory Visit (INDEPENDENT_AMBULATORY_CARE_PROVIDER_SITE_OTHER): Payer: 59 | Admitting: Family

## 2021-08-06 ENCOUNTER — Other Ambulatory Visit: Payer: Self-pay

## 2021-08-06 ENCOUNTER — Encounter: Payer: Self-pay | Admitting: Family

## 2021-08-06 VITALS — BP 118/80 | HR 88 | Temp 96.8°F | Resp 18 | Ht 70.0 in | Wt 291.2 lb

## 2021-08-06 DIAGNOSIS — Z23 Encounter for immunization: Secondary | ICD-10-CM | POA: Diagnosis not present

## 2021-08-06 DIAGNOSIS — F988 Other specified behavioral and emotional disorders with onset usually occurring in childhood and adolescence: Secondary | ICD-10-CM | POA: Diagnosis not present

## 2021-08-06 DIAGNOSIS — Z1231 Encounter for screening mammogram for malignant neoplasm of breast: Secondary | ICD-10-CM | POA: Diagnosis not present

## 2021-08-06 DIAGNOSIS — E782 Mixed hyperlipidemia: Secondary | ICD-10-CM | POA: Diagnosis not present

## 2021-08-06 DIAGNOSIS — Z1211 Encounter for screening for malignant neoplasm of colon: Secondary | ICD-10-CM

## 2021-08-06 DIAGNOSIS — Z6841 Body Mass Index (BMI) 40.0 and over, adult: Secondary | ICD-10-CM

## 2021-08-06 NOTE — Progress Notes (Signed)
Provider: Marlowe Sax FNP-C   Elycia Woodside, Nelda Bucks, NP  Patient Care Team: Kristoph Sattler, Nelda Bucks, NP as PCP - General (Family Medicine)  Extended Emergency Contact Information Primary Emergency Contact: McDough,Peg Address: [place] [address] [place] Phone: [phone] Relation: Sister  Code Status:  Full Code  Goals of care: Advanced Directive information Advanced Directives 08/06/2021  Does Patient Have a Medical Advance Directive? No  Would patient like information on creating a medical advance directive? No - Patient declined     Chief Complaint  Patient presents with   Medical Management of Chronic Issues    9 month follow up.   Health Maintenance    Discuss the need for Mammogram, and Colonoscopy.    Immunizations    Discuss the need for Shingrix vaccine, Covid Booster, and Influenza vaccine.    HPI:  Pt is a 61 y.o. female seen today for 9 months follow up for medical management of chronic diseases. She denies any acute issues.  She continues to require Adderall  for ADHD.  States has made some new year dietary changes.Has lost 5 lbs since last seen.states feels good with the changes she is making.   Due for shingles,COVID-19 and Influenza vaccine.she will get her Influenza vaccine today.  Also due for breast cancer screening and colon cancer screening.    Past Medical History:  Diagnosis Date   ADD (attention deficit disorder) 05/02/2014   Allergy    Mixed hyperlipidemia 08/20/2016   Obesity 05/02/2014   Past Surgical History:  Procedure Laterality Date   COSMETIC SURGERY     GASTRIC BYPASS  2002    No Known Allergies  Allergies as of 08/06/2021   No Known Allergies      Medication List        Accurate as of August 06, 2021  8:43 AM. If you have any questions, ask your nurse or doctor.          amphetamine-dextroamphetamine 20 MG tablet Commonly known as: ADDERALL Take 1 tablet (20 mg total) by  mouth 2 (two) times daily.   ibuprofen 200 MG tablet Commonly known as: ADVIL Take 200 mg by mouth as needed.   multivitamin tablet Take 1 tablet by mouth daily.   OVER THE COUNTER MEDICATION Vitamin D 3, One capsule daily.   OVER THE COUNTER MEDICATION B12 vitamin tablet once daily        Review of Systems  Constitutional:  Negative for appetite change, chills, fatigue, fever and unexpected weight change.  HENT:  Negative for congestion, dental problem, ear discharge, ear pain, facial swelling, hearing loss, nosebleeds, postnasal drip, rhinorrhea, sinus pressure, sinus pain, sneezing, sore throat, tinnitus and trouble swallowing.   Eyes:  Negative for pain, discharge, redness, itching and visual disturbance.  Respiratory:  Negative for cough, chest tightness, shortness of breath and wheezing.   Cardiovascular:  Negative for chest pain, palpitations and leg swelling.  Gastrointestinal:  Negative for abdominal distention, abdominal pain, blood in stool, constipation, diarrhea, nausea and vomiting.  Endocrine: Negative for cold intolerance, heat intolerance, polydipsia, polyphagia and polyuria.  Genitourinary:  Negative for difficulty urinating, dysuria, flank pain, frequency and urgency.  Musculoskeletal:  Negative for arthralgias, back pain, gait problem, joint swelling, myalgias, neck pain and neck stiffness.  Skin:  Negative for color change, pallor, rash and wound.  Neurological:  Negative for dizziness, syncope, speech difficulty, weakness, light-headedness, numbness and headaches.  Hematological:  Does not bruise/bleed easily.  Psychiatric/Behavioral:  Negative for agitation, behavioral problems, confusion, hallucinations, self-injury, sleep disturbance and suicidal ideas. The patient is not nervous/anxious.    Immunization History  Administered Date(s) Administered   Influenza,inj,Quad PF,6+ Mos 05/11/2015, 08/20/2016, 05/21/2017, 06/09/2018, 06/28/2019   Moderna  Sars-Covid-2 Vaccination 10/14/2019, 11/11/2019   Tdap 07/14/2008, 09/17/2020   Pertinent  Health Maintenance Due  Topic Date Due   COLONOSCOPY (Pts 45-71yr Insurance coverage will need to be confirmed)  Never done   MAMMOGRAM  12/13/2011   INFLUENZA VACCINE  02/11/2021   PAP SMEAR-Modifier  09/18/2023   Fall Risk 06/09/2018 01/04/2019 02/28/2020 11/07/2020 08/06/2021  Falls in the past year? 0 0 0 0 0  Was there an injury with Fall? - 0 0 0 0  Fall Risk Category Calculator - 0 0 0 0  Fall Risk Category - Low Low Low Low  Patient Fall Risk Level - Low fall risk Low fall risk Low fall risk Low fall risk  Patient at Risk for Falls Due to - - - - No Fall Risks  Fall risk Follow up - - - - Falls evaluation completed   Functional Status Survey:    Vitals:   08/06/21 0836  BP: 118/80  Pulse: 88  Resp: 18  Temp: (!) 96.8 F (36 C)  SpO2: 97%  Weight: 291 lb 3.2 oz (132.1 kg)  Height: 5' 10"  (1.778 m)   Body mass index is 41.78 kg/m. Physical Exam Vitals reviewed.  Constitutional:      General: She is not in acute distress.    Appearance: Normal appearance. She is obese. She is not ill-appearing or diaphoretic.  HENT:     Head: Normocephalic.     Right Ear: Tympanic membrane, ear canal and external ear normal. There is no impacted cerumen.     Left Ear: Tympanic membrane, ear canal and external ear normal. There is no impacted cerumen.     Nose: Nose normal. No congestion or rhinorrhea.     Mouth/Throat:     Mouth: Mucous membranes are moist.     Pharynx: Oropharynx is clear. No oropharyngeal exudate or posterior oropharyngeal erythema.  Eyes:     General: No scleral icterus.       Right eye: No discharge.        Left eye: No discharge.     Extraocular Movements: Extraocular movements intact.     Conjunctiva/sclera: Conjunctivae normal.     Pupils: Pupils are equal, round, and reactive to light.  Neck:     Vascular: No carotid bruit.  Cardiovascular:     Rate and  Rhythm: Normal rate and regular rhythm.     Pulses: Normal pulses.     Heart sounds: Normal heart sounds. No murmur heard.   No friction rub. No gallop.  Pulmonary:     Effort: Pulmonary effort is normal. No respiratory distress.     Breath sounds: Normal breath sounds. No wheezing, rhonchi or rales.  Chest:     Chest wall: No tenderness.  Abdominal:     General: Bowel sounds are normal. There is no distension.     Palpations: Abdomen is soft. There is no mass.     Tenderness: There is no abdominal tenderness. There is no right CVA tenderness, left CVA tenderness, guarding or rebound.  Musculoskeletal:        General: No swelling or tenderness. Normal range of motion.     Cervical back: Normal range of motion. No rigidity or tenderness.  Right lower leg: No edema.     Left lower leg: No edema.  Lymphadenopathy:     Cervical: No cervical adenopathy.  Skin:    General: Skin is warm and dry.     Coloration: Skin is not pale.     Findings: No bruising, erythema, lesion or rash.  Neurological:     Mental Status: She is alert and oriented to person, place, and time.     Cranial Nerves: No cranial nerve deficit.     Sensory: No sensory deficit.     Motor: No weakness.     Coordination: Coordination normal.     Gait: Gait normal.  Psychiatric:        Mood and Affect: Mood normal.        Speech: Speech normal.        Behavior: Behavior normal.        Thought Content: Thought content normal.        Judgment: Judgment normal.    Labs reviewed: No results for input(s): NA, K, CL, CO2, GLUCOSE, BUN, CREATININE, CALCIUM, MG, PHOS in the last 8760 hours. No results for input(s): AST, ALT, ALKPHOS, BILITOT, PROT, ALBUMIN in the last 8760 hours. No results for input(s): WBC, NEUTROABS, HGB, HCT, MCV, PLT in the last 8760 hours. Lab Results  Component Value Date   TSH 2.43 02/28/2020   Lab Results  Component Value Date   HGBA1C 5.0 02/28/2020   Lab Results  Component Value Date    CHOL 211 (H) 02/28/2020   HDL 54 02/28/2020   LDLCALC 136 (H) 02/28/2020   TRIG 100 02/28/2020   CHOLHDL 3.9 02/28/2020    Significant Diagnostic Results in last 30 days:  No results found.  Assessment/Plan 1. Attention deficit disorder (ADD) without hyperactivity Stable on Adderall PDMP reviewed  Contract in place  - CBC with Differential/Platelet; Future - BMP with eGFR([facility]); Future  2. Mixed hyperlipidemia LDL 136 Continue dietary modification and exercise  - Lipid Panel; Future  3. Breast cancer screening by mammogram Asymptomatic  - MM DIGITAL SCREENING BILATERAL; Future  4. Colon cancer screening Asymptomatic  Made aware GI office will call to schedule for appointment  - Ambulatory referral to Gastroenterology  5. Need for influenza vaccination Afebrile  Flut shot administered by CMA no acute reaction reported.   - Flu Vaccine QUAD 6+ mos PF IM (Fluarix Quad PF)  6. Body mass index (BMI) of 40.0 to 44.9 in adult (HCC) BMI 41.78 Continue with dietary modification and exercise   7. Morbid obesity ([place]) Has made some new resolution for dietary changes.states feels good. Has lost 5 lbs since last seen.Encouraged to continue with dietary modification and exercise.   Family/ staff Communication: Reviewed plan of care with patient verbalized understanding.   Labs/tests ordered:  - CBC with Differential/Platelet - CMP with eGFR([facility]) - Lipid panel  Next Appointment : 6 months for medical management of chronic issues.Fasting labs in one week or sooner   Sandrea Hughs, NP

## 2021-08-06 NOTE — Patient Instructions (Signed)
- 

## 2021-08-09 ENCOUNTER — Other Ambulatory Visit: Payer: Self-pay

## 2021-08-09 ENCOUNTER — Other Ambulatory Visit: Payer: 59

## 2021-08-09 DIAGNOSIS — E782 Mixed hyperlipidemia: Secondary | ICD-10-CM

## 2021-08-09 DIAGNOSIS — F988 Other specified behavioral and emotional disorders with onset usually occurring in childhood and adolescence: Secondary | ICD-10-CM

## 2021-08-10 LAB — CBC WITH DIFFERENTIAL/PLATELET
Absolute Monocytes: 462 cells/uL (ref 200–950)
Basophils Absolute: 40 cells/uL (ref 0–200)
Basophils Relative: 0.7 %
Eosinophils Absolute: 182 cells/uL (ref 15–500)
Eosinophils Relative: 3.2 %
HCT: 46.1 % — ABNORMAL HIGH (ref 35.0–45.0)
Hemoglobin: 15.4 g/dL (ref 11.7–15.5)
Lymphs Abs: 1835 cells/uL (ref 850–3900)
MCH: 29.1 pg (ref 27.0–33.0)
MCHC: 33.4 g/dL (ref 32.0–36.0)
MCV: 87.1 fL (ref 80.0–100.0)
MPV: 10.4 fL (ref 7.5–12.5)
Monocytes Relative: 8.1 %
Neutro Abs: 3181 cells/uL (ref 1500–7800)
Neutrophils Relative %: 55.8 %
Platelets: 265 10*3/uL (ref 140–400)
RBC: 5.29 10*6/uL — ABNORMAL HIGH (ref 3.80–5.10)
RDW: 13.1 % (ref 11.0–15.0)
Total Lymphocyte: 32.2 %
WBC: 5.7 10*3/uL (ref 3.8–10.8)

## 2021-08-10 LAB — BASIC METABOLIC PANEL WITH GFR
BUN: 12 mg/dL (ref 7–25)
CO2: 29 mmol/L (ref 20–32)
Calcium: 8.8 mg/dL (ref 8.6–10.4)
Chloride: 105 mmol/L (ref 98–110)
Creat: 0.64 mg/dL (ref 0.50–1.03)
Glucose, Bld: 91 mg/dL (ref 65–99)
Potassium: 4.2 mmol/L (ref 3.5–5.3)
Sodium: 141 mmol/L (ref 135–146)
eGFR: 102 mL/min/{1.73_m2} (ref >=60)

## 2021-08-10 LAB — LIPID PANEL
Cholesterol: 198 mg/dL (ref <200)
HDL: 46 mg/dL — ABNORMAL LOW (ref >=50)
LDL Cholesterol (Calc): 134 mg/dL (calc) — ABNORMAL HIGH
Non-HDL Cholesterol (Calc): 152 mg/dL (calc) — ABNORMAL HIGH (ref <130)
Total CHOL/HDL Ratio: 4.3 (calc) (ref <5.0)
Triglycerides: 84 mg/dL (ref <150)

## 2021-08-28 ENCOUNTER — Other Ambulatory Visit: Payer: Self-pay | Admitting: Adult Health

## 2021-08-28 ENCOUNTER — Other Ambulatory Visit: Payer: Self-pay

## 2021-08-28 DIAGNOSIS — F988 Other specified behavioral and emotional disorders with onset usually occurring in childhood and adolescence: Secondary | ICD-10-CM

## 2021-08-28 MED ORDER — AMPHETAMINE-DEXTROAMPHETAMINE 20 MG PO TABS: 20.0000 mg | ORAL_TABLET | Freq: Two times a day (BID) | ORAL | 0 refills | Status: DC

## 2021-08-28 NOTE — Telephone Encounter (Signed)
Webb Silversmith is out of office and I am forwarding to covering provider

## 2021-08-28 NOTE — Telephone Encounter (Signed)
RX last refilled on 07/31/2021, treatment agreement on file from 05/2021

## 2021-08-30 ENCOUNTER — Other Ambulatory Visit: Payer: Self-pay | Admitting: Adult Health

## 2021-08-30 NOTE — Telephone Encounter (Signed)
Noted! Thank you

## 2021-08-30 NOTE — Telephone Encounter (Signed)
Patient medication "Adderall 20mg " was refilled 08/28/2021 by Ok Edwards, NP with 60 tablets. Directions state for patient to take 1 tablet by mouth two times daily. I'm not sure why message states patient medication was denied. Pharmacy "[facility]" was called and pharmacy tech states that patient picked up medication yesterday 08/29/2021.

## 2021-08-30 NOTE — Telephone Encounter (Signed)
Was patient's medication refilled ? Just got a message saying denied.

## 2021-08-30 NOTE — Telephone Encounter (Signed)
Jasmine Please call pharmacy and verify why refill has been cancelled.

## 2021-09-30 ENCOUNTER — Ambulatory Visit
Admission: RE | Admit: 2021-09-30 | Discharge: 2021-09-30 | Disposition: A | Discharge status: Home / Self Care | Payer: 59 | Source: Ambulatory Visit | Attending: Family | Admitting: Family

## 2021-09-30 DIAGNOSIS — Z1231 Encounter for screening mammogram for malignant neoplasm of breast: Secondary | ICD-10-CM

## 2021-10-01 ENCOUNTER — Other Ambulatory Visit: Payer: Self-pay | Admitting: Adult Health

## 2021-10-01 DIAGNOSIS — F988 Other specified behavioral and emotional disorders with onset usually occurring in childhood and adolescence: Secondary | ICD-10-CM

## 2021-10-01 MED ORDER — AMPHETAMINE-DEXTROAMPHETAMINE 20 MG PO TABS: 20.0000 mg | ORAL_TABLET | Freq: Two times a day (BID) | ORAL | 0 refills | Status: DC

## 2021-10-28 ENCOUNTER — Other Ambulatory Visit: Payer: Self-pay | Admitting: *Deleted

## 2021-10-28 DIAGNOSIS — F988 Other specified behavioral and emotional disorders with onset usually occurring in childhood and adolescence: Secondary | ICD-10-CM

## 2021-10-28 MED ORDER — AMPHETAMINE-DEXTROAMPHETAMINE 20 MG PO TABS: 20.0000 mg | ORAL_TABLET | Freq: Two times a day (BID) | ORAL | 0 refills | Status: DC

## 2021-10-28 NOTE — Telephone Encounter (Signed)
Patient requested refill.  ?Epic LR: 10/01/2021 ?Contract Date: 05/21/2021 ?Pended Rx and sent to [facility] for approval.  ?

## 2021-12-02 ENCOUNTER — Other Ambulatory Visit: Payer: Self-pay | Admitting: *Deleted

## 2021-12-02 DIAGNOSIS — F988 Other specified behavioral and emotional disorders with onset usually occurring in childhood and adolescence: Secondary | ICD-10-CM

## 2021-12-02 MED ORDER — AMPHETAMINE-DEXTROAMPHETAMINE 20 MG PO TABS: 20.0000 mg | ORAL_TABLET | Freq: Two times a day (BID) | ORAL | 0 refills | Status: DC

## 2021-12-02 NOTE — Telephone Encounter (Signed)
Patient requested refill.  Epic LR: 10/28/2021 Contract Date: 05/21/2021 Pended Rx and sent to [facility] for approval.

## 2021-12-31 ENCOUNTER — Other Ambulatory Visit: Payer: Self-pay | Admitting: Family

## 2021-12-31 DIAGNOSIS — F988 Other specified behavioral and emotional disorders with onset usually occurring in childhood and adolescence: Secondary | ICD-10-CM

## 2021-12-31 NOTE — Telephone Encounter (Signed)
Please Forward script for Dr.Miller to refill since my phone not working for approval.

## 2022-01-02 MED ORDER — AMPHETAMINE-DEXTROAMPHETAMINE 20 MG PO TABS: 20.0000 mg | ORAL_TABLET | Freq: Two times a day (BID) | ORAL | 0 refills | Status: DC

## 2022-01-30 ENCOUNTER — Other Ambulatory Visit: Payer: Self-pay

## 2022-01-30 DIAGNOSIS — F988 Other specified behavioral and emotional disorders with onset usually occurring in childhood and adolescence: Secondary | ICD-10-CM

## 2022-01-30 NOTE — Telephone Encounter (Signed)
Patient has request refill on medication Adderall. Patient last refill dated 01/02/2022. Patient has Non Opioid Contract signed 06/28/2019. Patient has upcoming appointment 02/07/2022. Update Contract added to patient appointment notes. Medication pend and sent to PCP Ngetich, Nelda Bucks, NP .

## 2022-01-31 MED ORDER — AMPHETAMINE-DEXTROAMPHETAMINE 20 MG PO TABS: 20.0000 mg | ORAL_TABLET | Freq: Two times a day (BID) | ORAL | 0 refills | Status: DC

## 2022-01-31 NOTE — Telephone Encounter (Signed)
Message re-routed to PCP Ngetich, Nelda Bucks, NP

## 2022-01-31 NOTE — Telephone Encounter (Signed)
Medication refilled

## 2022-02-06 NOTE — Patient Instructions (Signed)
Please contact your local pharmacy, previous provider, or insurance carrier for vaccine/immunization records. Ensure that any procedures done outside of [facility] are faxed to us [phone] or you can sign release of records form at the front desk to keep your medical record updated.   ?

## 2022-02-07 ENCOUNTER — Encounter: Payer: Self-pay | Admitting: Family

## 2022-02-07 ENCOUNTER — Ambulatory Visit (INDEPENDENT_AMBULATORY_CARE_PROVIDER_SITE_OTHER): Payer: 59 | Admitting: Family

## 2022-02-07 VITALS — BP 126/82 | HR 77 | Temp 96.7°F | Resp 18 | Ht 70.0 in | Wt 286.6 lb

## 2022-02-07 DIAGNOSIS — M21611 Bunion of right foot: Secondary | ICD-10-CM | POA: Diagnosis not present

## 2022-02-07 DIAGNOSIS — E782 Mixed hyperlipidemia: Secondary | ICD-10-CM

## 2022-02-07 DIAGNOSIS — M21612 Bunion of left foot: Secondary | ICD-10-CM

## 2022-02-07 DIAGNOSIS — Z1211 Encounter for screening for malignant neoplasm of colon: Secondary | ICD-10-CM

## 2022-02-07 DIAGNOSIS — Z6841 Body Mass Index (BMI) 40.0 and over, adult: Secondary | ICD-10-CM

## 2022-02-07 DIAGNOSIS — F988 Other specified behavioral and emotional disorders with onset usually occurring in childhood and adolescence: Secondary | ICD-10-CM

## 2022-02-07 NOTE — Progress Notes (Signed)
Provider: Marlowe Sax FNP-C   Aviv Lengacher, Nelda Bucks, NP  Patient Care Team: Joby Hershkowitz, Nelda Bucks, NP as PCP - General (Family Medicine)  Extended Emergency Contact Information Primary Emergency Contact: McDough,Peg Address: [place] [address] [place] Phone: [phone] Relation: Sister  Code Status:  Full Code  Goals of care: Advanced Directive information    02/07/2022    8:43 AM  Advanced Directives  Does Patient Have a Medical Advance Directive? No  Would patient like information on creating a medical advance directive? No - Patient declined     Chief Complaint  Patient presents with  . Medical Management of Chronic Issues    6 month follow up.  Marland Kitchen Health Maintenance    Discuss the need for Colonoscopy.  . Immunizations    Discuss the need for 2nd Shingrix vaccine, and Covid Booster.    HPI:  Pt is a 61 y.o. female seen today for medical management of chronic diseases.     Past Medical History:  Diagnosis Date  . ADD (attention deficit disorder) 05/02/2014  . Allergy   . Mixed hyperlipidemia 08/20/2016  . Obesity 05/02/2014   Past Surgical History:  Procedure Laterality Date  . COSMETIC SURGERY    . GASTRIC BYPASS  2002    No Known Allergies  Allergies as of 02/07/2022   No Known Allergies      Medication List        Accurate as of February 07, 2022  9:08 AM. If you have any questions, ask your nurse or doctor.          amphetamine-dextroamphetamine 20 MG tablet Commonly known as: ADDERALL Take 1 tablet (20 mg total) by mouth 2 (two) times daily.   ibuprofen 200 MG tablet Commonly known as: ADVIL Take 200 mg by mouth as needed.   multivitamin tablet Take 1 tablet by mouth daily.   OVER THE COUNTER MEDICATION Vitamin D 3, One capsule daily.   OVER THE COUNTER MEDICATION B12 vitamin tablet once daily   PROBIOTIC PO Take 1 capsule by mouth as needed.        Review of  Systems  Immunization History  Administered Date(s) Administered  . Influenza,inj,Quad PF,6+ Mos 05/11/2015, 08/20/2016, 05/21/2017, 06/09/2018, 06/28/2019, 08/06/2021  . Moderna Sars-Covid-2 Vaccination 10/14/2019, 11/11/2019  . Tdap 07/14/2008, 09/17/2020, 01/08/2022  . Tetanus 01/08/2022  . Zoster Recombinat (Shingrix) 01/08/2022   Pertinent  Health Maintenance Due  Topic Date Due  . COLONOSCOPY (Pts 45-40yr Insurance coverage will need to be confirmed)  Never done  . INFLUENZA VACCINE  02/11/2022  . PAP SMEAR-Modifier  09/18/2023  . MAMMOGRAM  10/01/2023      01/04/2019    9:42 AM 02/28/2020    8:43 AM 11/07/2020    7:58 AM 08/06/2021    8:29 AM 02/07/2022    8:43 AM  Fall Risk  Falls in the past year? 0 0 0 0 0  Was there an injury with Fall? 0 0 0 0 0  Fall Risk Category Calculator 0 0 0 0 0  Fall Risk Category Low Low Low Low Low  Patient Fall Risk Level Low fall risk Low fall risk Low fall risk Low fall risk Low fall risk  Patient at Risk for Falls Due to    No Fall Risks No Fall Risks  Fall risk Follow up    Falls evaluation completed Falls evaluation completed   Functional Status Survey:  Vitals:   02/07/22 0836  BP: 126/82  Pulse: 77  Resp: 18  Temp: (!) 96.7 F (35.9 C)  SpO2: 97%  Weight: 286 lb 9.6 oz (130 kg)  Height: '5\' 10"'$  (1.778 m)   Body mass index is 41.12 kg/m. Physical Exam  Labs reviewed: Recent Labs    08/09/21 0805  NA 141  K 4.2  CL 105  CO2 29  GLUCOSE 91  BUN 12  CREATININE 0.64  CALCIUM 8.8   No results for input(s): "AST", "ALT", "ALKPHOS", "BILITOT", "PROT", "ALBUMIN" in the last 8760 hours. Recent Labs    08/09/21 0805  WBC 5.7  NEUTROABS 3,181  HGB 15.4  HCT 46.1*  MCV 87.1  PLT 265   Lab Results  Component Value Date   TSH 2.43 02/28/2020   Lab Results  Component Value Date   HGBA1C 5.0 02/28/2020   Lab Results  Component Value Date   CHOL 198 08/09/2021   HDL 46 (L) 08/09/2021   LDLCALC 134 (H)  08/09/2021   TRIG 84 08/09/2021   CHOLHDL 4.3 08/09/2021    Significant Diagnostic Results in last 30 days:  No results found.  Assessment/Plan 1. Mixed hyperlipidemia ***  2. Bunion of left foot ***  3. Bunion of right foot ***    Family/ staff Communication: Reviewed plan of care with patient  Labs/tests ordered: None   Next Appointment :   Sandrea Hughs, NP

## 2022-02-12 ENCOUNTER — Other Ambulatory Visit: Payer: 59

## 2022-02-12 DIAGNOSIS — M21611 Bunion of right foot: Secondary | ICD-10-CM

## 2022-02-12 DIAGNOSIS — M21612 Bunion of left foot: Secondary | ICD-10-CM

## 2022-02-12 DIAGNOSIS — F988 Other specified behavioral and emotional disorders with onset usually occurring in childhood and adolescence: Secondary | ICD-10-CM

## 2022-02-12 DIAGNOSIS — Z6841 Body Mass Index (BMI) 40.0 and over, adult: Secondary | ICD-10-CM

## 2022-02-12 DIAGNOSIS — E782 Mixed hyperlipidemia: Secondary | ICD-10-CM

## 2022-02-14 LAB — DRUG MONITOR, PANEL 1, W/CONF, URINE
Amphetamine: 629 ng/mL — ABNORMAL HIGH (ref <250)
Amphetamines: POSITIVE ng/mL — AB (ref <500)
Barbiturates: NEGATIVE ng/mL (ref <300)
Benzodiazepines: NEGATIVE ng/mL (ref <100)
Cocaine Metabolite: NEGATIVE ng/mL (ref <150)
Creatinine: 24.3 mg/dL (ref >=20.0)
Marijuana Metabolite: NEGATIVE ng/mL (ref <20)
Methadone Metabolite: NEGATIVE ng/mL (ref <100)
Methamphetamine: NEGATIVE ng/mL (ref <250)
Opiates: NEGATIVE ng/mL (ref <100)
Oxidant: NEGATIVE ug/mL (ref <200)
Oxycodone: NEGATIVE ng/mL (ref <100)
Phencyclidine: NEGATIVE ng/mL (ref <25)
pH: 7 (ref 4.5–9.0)

## 2022-02-14 LAB — CBC WITH DIFFERENTIAL/PLATELET
Absolute Monocytes: 338 cells/uL (ref 200–950)
Basophils Absolute: 29 cells/uL (ref 0–200)
Basophils Relative: 0.6 %
Eosinophils Absolute: 240 cells/uL (ref 15–500)
Eosinophils Relative: 4.9 %
HCT: 43.5 % (ref 35.0–45.0)
Hemoglobin: 14.4 g/dL (ref 11.7–15.5)
Lymphs Abs: 1519 cells/uL (ref 850–3900)
MCH: 29.1 pg (ref 27.0–33.0)
MCHC: 33.1 g/dL (ref 32.0–36.0)
MCV: 87.9 fL (ref 80.0–100.0)
MPV: 9.7 fL (ref 7.5–12.5)
Monocytes Relative: 6.9 %
Neutro Abs: 2773 cells/uL (ref 1500–7800)
Neutrophils Relative %: 56.6 %
Platelets: 243 10*3/uL (ref 140–400)
RBC: 4.95 10*6/uL (ref 3.80–5.10)
RDW: 13.8 % (ref 11.0–15.0)
Total Lymphocyte: 31 %
WBC: 4.9 10*3/uL (ref 3.8–10.8)

## 2022-02-14 LAB — COMPLETE METABOLIC PANEL WITH GFR
AG Ratio: 1.4 (calc) (ref 1.0–2.5)
ALT: 13 U/L (ref 6–29)
AST: 13 U/L (ref 10–35)
Albumin: 3.7 g/dL (ref 3.6–5.1)
Alkaline phosphatase (APISO): 81 U/L (ref 37–153)
BUN: 12 mg/dL (ref 7–25)
CO2: 27 mmol/L (ref 20–32)
Calcium: 8.8 mg/dL (ref 8.6–10.4)
Chloride: 102 mmol/L (ref 98–110)
Creat: 0.68 mg/dL (ref 0.50–1.05)
Globulin: 2.7 g/dL (calc) (ref 1.9–3.7)
Glucose, Bld: 91 mg/dL (ref 65–99)
Potassium: 4.3 mmol/L (ref 3.5–5.3)
Sodium: 137 mmol/L (ref 135–146)
Total Bilirubin: 0.4 mg/dL (ref 0.2–1.2)
Total Protein: 6.4 g/dL (ref 6.1–8.1)
eGFR: 100 mL/min/{1.73_m2} (ref >=60)

## 2022-02-14 LAB — TSH: TSH: 2.6 mIU/L (ref 0.40–4.50)

## 2022-02-14 LAB — LIPID PANEL
Cholesterol: 206 mg/dL — ABNORMAL HIGH (ref <200)
HDL: 58 mg/dL (ref >=50)
LDL Cholesterol (Calc): 128 mg/dL (calc) — ABNORMAL HIGH
Non-HDL Cholesterol (Calc): 148 mg/dL (calc) — ABNORMAL HIGH (ref <130)
Total CHOL/HDL Ratio: 3.6 (calc) (ref <5.0)
Triglycerides: 102 mg/dL (ref <150)

## 2022-02-14 LAB — DM TEMPLATE

## 2022-03-06 ENCOUNTER — Other Ambulatory Visit: Payer: Self-pay | Admitting: Family

## 2022-03-06 DIAGNOSIS — F988 Other specified behavioral and emotional disorders with onset usually occurring in childhood and adolescence: Secondary | ICD-10-CM

## 2022-03-06 NOTE — Telephone Encounter (Signed)
My chart message received :  " Refills have been requested for the following medications:       amphetamine-dextroamphetamine (ADDERALL) 20 MG tablet [Mayling Aber C Heike Pounds]     Patient Comment: Can you send this to [facility] at [facility]   Preferred pharmacy: [facility] [place] [address] Delivery method: Pickup"  Please verify with patient why change of pharmacy [facility] not on current contract

## 2022-03-07 MED ORDER — AMPHETAMINE-DEXTROAMPHETAMINE 20 MG PO TABS: 20.0000 mg | ORAL_TABLET | Freq: Two times a day (BID) | ORAL | 0 refills | Status: DC

## 2022-03-07 NOTE — Telephone Encounter (Signed)
Called and spoke with patient and the reason of the change in pharmacies is because [facility] does not had the Adderall in stock.

## 2022-03-07 NOTE — Addendum Note (Signed)
Addended by: Rafael Bihari A on: 03/07/2022 03:11 PM   Modules accepted: Orders

## 2022-04-04 ENCOUNTER — Other Ambulatory Visit: Payer: Self-pay | Admitting: Nurse Practitioner

## 2022-04-04 DIAGNOSIS — F988 Other specified behavioral and emotional disorders with onset usually occurring in childhood and adolescence: Secondary | ICD-10-CM

## 2022-04-04 MED ORDER — AMPHETAMINE-DEXTROAMPHETAMINE 20 MG PO TABS: 20.0000 mg | ORAL_TABLET | Freq: Two times a day (BID) | ORAL | 0 refills | Status: DC

## 2022-04-04 NOTE — Telephone Encounter (Signed)
Patient requested refill.  Epic LR: 03/07/2022  Pended Rx and sent to [facility] for approval due to Webb Silversmith out of office.

## 2022-05-06 ENCOUNTER — Other Ambulatory Visit: Payer: Self-pay | Admitting: Nurse Practitioner

## 2022-05-06 DIAGNOSIS — F988 Other specified behavioral and emotional disorders with onset usually occurring in childhood and adolescence: Secondary | ICD-10-CM

## 2022-05-06 MED ORDER — AMPHETAMINE-DEXTROAMPHETAMINE 20 MG PO TABS: 20.0000 mg | ORAL_TABLET | Freq: Two times a day (BID) | ORAL | 0 refills | Status: DC

## 2022-05-06 NOTE — Telephone Encounter (Signed)
Pharmacy requested refill.  Epic LR: 04/07/2022 Contract Date: 02/06/2022 Pended Rx and sent to [facility] for approval.

## 2022-06-09 ENCOUNTER — Other Ambulatory Visit: Payer: Self-pay | Admitting: Family

## 2022-06-09 DIAGNOSIS — F988 Other specified behavioral and emotional disorders with onset usually occurring in childhood and adolescence: Secondary | ICD-10-CM

## 2022-06-09 MED ORDER — AMPHETAMINE-DEXTROAMPHETAMINE 20 MG PO TABS: 20.0000 mg | ORAL_TABLET | Freq: Two times a day (BID) | ORAL | 0 refills | Status: DC

## 2022-06-09 NOTE — Progress Notes (Signed)
Adderall refilled

## 2022-07-15 ENCOUNTER — Other Ambulatory Visit: Payer: Self-pay

## 2022-07-15 DIAGNOSIS — F988 Other specified behavioral and emotional disorders with onset usually occurring in childhood and adolescence: Secondary | ICD-10-CM

## 2022-07-15 MED ORDER — AMPHETAMINE-DEXTROAMPHETAMINE 20 MG PO TABS: 20.0000 mg | ORAL_TABLET | Freq: Two times a day (BID) | ORAL | 0 refills | Status: DC

## 2022-07-15 NOTE — Telephone Encounter (Signed)
From: Lucile Shutters To: Office of Sandrea Hughs, NP Sent: 07/15/2022 7:39 AM EST Subject: Medication Renewal Request  Refills have been requested for the following medications:   amphetamine-dextroamphetamine (ADDERALL) 20 MG tablet [Dinah C Ngetich]  Preferred pharmacy: [facility] [id number] - [address], [address] Delivery method: [employer]

## 2022-07-15 NOTE — Telephone Encounter (Signed)
Adderall refill send to pharmacy.

## 2022-07-15 NOTE — Telephone Encounter (Signed)
Patient is requesting a refill of the following medications: Requested Prescriptions   Pending Prescriptions Disp Refills   amphetamine-dextroamphetamine (ADDERALL) 20 MG tablet 60 tablet 0    Sig: Take 1 tablet (20 mg total) by mouth 2 (two) times daily.    Date of last refill:06/09/22  Refill amount: 60  Treatment agreement date: 02/06/2022

## 2022-08-13 ENCOUNTER — Ambulatory Visit (INDEPENDENT_AMBULATORY_CARE_PROVIDER_SITE_OTHER): Payer: 59 | Admitting: Family

## 2022-08-13 ENCOUNTER — Encounter: Payer: Self-pay | Admitting: Family

## 2022-08-13 VITALS — BP 118/86 | HR 89 | Temp 96.0°F | Resp 18 | Ht 70.0 in | Wt 290.4 lb

## 2022-08-13 DIAGNOSIS — Z1211 Encounter for screening for malignant neoplasm of colon: Secondary | ICD-10-CM | POA: Diagnosis not present

## 2022-08-13 DIAGNOSIS — Z5181 Encounter for therapeutic drug level monitoring: Secondary | ICD-10-CM | POA: Diagnosis not present

## 2022-08-13 DIAGNOSIS — E782 Mixed hyperlipidemia: Secondary | ICD-10-CM

## 2022-08-13 DIAGNOSIS — F988 Other specified behavioral and emotional disorders with onset usually occurring in childhood and adolescence: Secondary | ICD-10-CM | POA: Diagnosis not present

## 2022-08-13 DIAGNOSIS — Z6841 Body Mass Index (BMI) 40.0 and over, adult: Secondary | ICD-10-CM

## 2022-08-13 MED ORDER — SEMAGLUTIDE(0.25 OR 0.5MG/DOS) 2 MG/3ML ~~LOC~~ SOPN: 0.5000 mg | PEN_INJECTOR | SUBCUTANEOUS | 0 refills | Status: DC

## 2022-08-13 MED ORDER — AMPHETAMINE-DEXTROAMPHETAMINE 20 MG PO TABS: 20.0000 mg | ORAL_TABLET | Freq: Two times a day (BID) | ORAL | 0 refills | Status: DC

## 2022-08-13 NOTE — Progress Notes (Signed)
Provider: Marlowe Sax FNP-C   Olayinka Gathers, Nelda Bucks, NP  Patient Care Team: Danielle Lento, Nelda Bucks, NP as PCP - General (Family Medicine)  Extended Emergency Contact Information Primary Emergency Contact: McDough,Peg Address: [place] [address] [place] Phone: [phone] Relation: Sister  Code Status:  Full Code  Goals of care: Advanced Directive information    08/13/2022    8:51 AM  Advanced Directives  Does Patient Have a Medical Advance Directive? No  Would patient like information on creating a medical advance directive? No - Patient declined     Chief Complaint  Patient presents with   Medical Management of Chronic Issues    6 month follow up.    Health Maintenance    Discuss the need for Colonoscopy.    Immunizations    Discuss the need for Influenza vaccine, Shingrix vaccine, and Covid Booster.    HPI:  Pt is a 62 y.o. female seen today for 6 months follow up for  medical management of chronic diseases.  Hyperlipidemia - previous LDL 128  States has been exercising by walking and has changed her diet to vegan but not successful in weight loss.Has gained 3 lbs since last visit. Due for colonoscopy but states does not have someone to take her for the procedure.Cologuard discussed.request kit to be ordered for Cologuard.  Due for shingles and Influenza vaccine.   Past Medical History:  Diagnosis Date   ADD (attention deficit disorder) 05/02/2014   Allergy    Mixed hyperlipidemia 08/20/2016   Obesity 05/02/2014   Past Surgical History:  Procedure Laterality Date   COSMETIC SURGERY     GASTRIC BYPASS  2002    No Known Allergies  Allergies as of 08/13/2022   No Known Allergies      Medication List        Accurate as of August 13, 2022  8:55 AM. If you have any questions, ask your nurse or doctor.          amphetamine-dextroamphetamine 20 MG tablet Commonly known as: ADDERALL Take 1 tablet (20 mg total)  by mouth 2 (two) times daily.   ibuprofen 200 MG tablet Commonly known as: ADVIL Take 200 mg by mouth as needed.   multivitamin tablet Take 1 tablet by mouth daily.   OVER THE COUNTER MEDICATION Vitamin D 3, One capsule daily.   OVER THE COUNTER MEDICATION B12 vitamin tablet once daily   PROBIOTIC PO Take 1 capsule by mouth as needed.        Review of Systems  Constitutional:  Negative for appetite change, chills, fatigue, fever and unexpected weight change.  HENT:  Negative for congestion, dental problem, ear discharge, ear pain, facial swelling, hearing loss, nosebleeds, postnasal drip, rhinorrhea, sinus pressure, sinus pain, sneezing, sore throat, tinnitus and trouble swallowing.   Eyes:  Negative for pain, discharge, redness, itching and visual disturbance.  Respiratory:  Negative for cough, chest tightness, shortness of breath and wheezing.   Cardiovascular:  Negative for chest pain, palpitations and leg swelling.  Gastrointestinal:  Negative for abdominal distention, abdominal pain, blood in stool, constipation, diarrhea, nausea and vomiting.  Endocrine: Negative for cold intolerance, heat intolerance, polydipsia, polyphagia and polyuria.  Genitourinary:  Negative for difficulty urinating, dysuria, flank pain, frequency and urgency.  Musculoskeletal:  Negative for arthralgias, back pain, gait problem, joint swelling, myalgias, neck pain and neck stiffness.  Skin:  Negative for color change, pallor, rash  and wound.  Neurological:  Negative for dizziness, syncope, speech difficulty, weakness, light-headedness, numbness and headaches.  Hematological:  Does not bruise/bleed easily.  Psychiatric/Behavioral:  Negative for agitation, behavioral problems, confusion, hallucinations, self-injury, sleep disturbance and suicidal ideas. The patient is not nervous/anxious.     Immunization History  Administered Date(s) Administered   Influenza,inj,Quad PF,6+ Mos 05/11/2015,  08/20/2016, 05/21/2017, 06/09/2018, 06/28/2019, 08/06/2021   Influenza-Unspecified 06/13/2022   Moderna Sars-Covid-2 Vaccination 10/14/2019, 11/11/2019   Tdap 07/14/2008, 09/17/2020, 01/08/2022   Tetanus 01/08/2022   Zoster Recombinat (Shingrix) 01/08/2022   Pertinent  Health Maintenance Due  Topic Date Due   COLONOSCOPY (Pts 45-34yr Insurance coverage will need to be confirmed)  Never done   PAP SMEAR-Modifier  09/18/2023   MAMMOGRAM  10/01/2023   INFLUENZA VACCINE  Completed      02/28/2020    8:43 AM 11/07/2020    7:58 AM 08/06/2021    8:29 AM 02/07/2022    8:43 AM 08/13/2022    8:51 AM  Fall Risk  Falls in the past year? 0 0 0 0 0  Was there an injury with Fall? 0 0 0 0 0  Fall Risk Category Calculator 0 0 0 0 0  Fall Risk Category (Retired) Low Low Low Low   (RETIRED) Patient Fall Risk Level Low fall risk Low fall risk Low fall risk Low fall risk   Patient at Risk for Falls Due to   No Fall Risks No Fall Risks History of fall(s)  Fall risk Follow up   Falls evaluation completed Falls evaluation completed Falls evaluation completed   Functional Status Survey:    Vitals:   08/13/22 0849  BP: 118/86  Pulse: 89  Resp: 18  Temp: (!) 96 F (35.6 C)  SpO2: 96%  Weight: 290 lb 6.4 oz (131.7 kg)  Height: 5' 10"$  (1.778 m)   Body mass index is 41.67 kg/m. Physical Exam Vitals reviewed.  Constitutional:      General: She is not in acute distress.    Appearance: Normal appearance. She is obese. She is not ill-appearing or diaphoretic.  HENT:     Head: Normocephalic.     Right Ear: Tympanic membrane, ear canal and external ear normal. There is no impacted cerumen.     Left Ear: Tympanic membrane, ear canal and external ear normal. There is no impacted cerumen.     Nose: Nose normal. No congestion or rhinorrhea.     Mouth/Throat:     Mouth: Mucous membranes are moist.     Pharynx: Oropharynx is clear. No oropharyngeal exudate or posterior oropharyngeal erythema.  Eyes:      General: No scleral icterus.       Right eye: No discharge.        Left eye: No discharge.     Extraocular Movements: Extraocular movements intact.     Conjunctiva/sclera: Conjunctivae normal.     Pupils: Pupils are equal, round, and reactive to light.  Neck:     Vascular: No carotid bruit.  Cardiovascular:     Rate and Rhythm: Normal rate and regular rhythm.     Pulses: Normal pulses.     Heart sounds: Normal heart sounds. No murmur heard.    No friction rub. No gallop.  Pulmonary:     Effort: Pulmonary effort is normal. No respiratory distress.     Breath sounds: Normal breath sounds. No wheezing, rhonchi or rales.  Chest:     Chest wall: No tenderness.  Abdominal:  General: Bowel sounds are normal. There is no distension.     Palpations: Abdomen is soft. There is no mass.     Tenderness: There is no abdominal tenderness. There is no right CVA tenderness, left CVA tenderness, guarding or rebound.  Musculoskeletal:        General: No swelling or tenderness. Normal range of motion.     Cervical back: Normal range of motion. No rigidity or tenderness.     Right lower leg: No edema.     Left lower leg: No edema.  Lymphadenopathy:     Cervical: No cervical adenopathy.  Skin:    General: Skin is warm and dry.     Coloration: Skin is not pale.     Findings: No bruising, erythema, lesion or rash.  Neurological:     Mental Status: She is alert and oriented to person, place, and time.     Cranial Nerves: No cranial nerve deficit.     Sensory: No sensory deficit.     Motor: No weakness.     Coordination: Coordination normal.     Gait: Gait normal.  Psychiatric:        Mood and Affect: Mood normal.        Speech: Speech normal.        Behavior: Behavior normal.        Thought Content: Thought content normal.        Judgment: Judgment normal.    Labs reviewed: Recent Labs    02/12/22 0803  NA 137  K 4.3  CL 102  CO2 27  GLUCOSE 91  BUN 12  CREATININE 0.68  CALCIUM  8.8   Recent Labs    02/12/22 0803  AST 13  ALT 13  BILITOT 0.4  PROT 6.4   Recent Labs    02/12/22 0803  WBC 4.9  NEUTROABS 2,773  HGB 14.4  HCT 43.5  MCV 87.9  PLT 243   Lab Results  Component Value Date   TSH 2.60 02/12/2022   Lab Results  Component Value Date   HGBA1C 5.0 02/28/2020   Lab Results  Component Value Date   CHOL 206 (H) 02/12/2022   HDL 58 02/12/2022   LDLCALC 128 (H) 02/12/2022   TRIG 102 02/12/2022   CHOLHDL 3.6 02/12/2022    Significant Diagnostic Results in last 30 days:  No results found.  Assessment/Plan 1. Mixed hyperlipidemia LDL not at goal 128  Dietary modification and exercise advised - Lipid Panel; Future  2. Attention deficit disorder (ADD) without hyperactivity Continue to require Adderall  PDMP reviewed due for refill. Contract updated  - will obtain UDT - CBC with Differential/Platelet; Future - COMPLETE METABOLIC PANEL WITH GFR; Future - DRUG MONITORING, PANEL 6 WITH CONFIRMATION, URINE - TSH; Future - DRUG TOXICOLOGY MONITORING BASE PANEL,W/CONFIRM,ORAL FLD  3. Colon cancer screening Asymptomatic  - Cologuard  4. Therapeutic drug monitoring Will obtain Urine drug test  - EKG 12-Lead indicates sinus rhythm - DRUG TOXICOLOGY MONITORING BASE PANEL,W/CONFIRM,ORAL FLD  5. Body mass index (BMI) of 40.1-44.9 in adult (HCC) BMI 41.67  Request weight loss medication.will start on semaglutide.side effects discussed.  - Semaglutide,0.25 or 0.5MG/DOS, 2 MG/3ML SOPN; Inject 0.5 mg into the skin once a week.  Dispense: 3 mL; Refill: 0  Family/ staff Communication: Reviewed plan of care with patient verbalized understanding.   Labs/tests ordered:  - EKG 12-Lead - DRUG TOXICOLOGY MONITORING BASE PANEL,W/CONFIRM,ORAL FLD - CBC with Differential/Platelet; Future - COMPLETE METABOLIC PANEL WITH GFR; Future -  DRUG MONITORING, PANEL 6 WITH CONFIRMATION, URINE - TSH; Future - Cologuard - Lipid Panel; Future  Next  Appointment : Return in about 6 months (around 02/11/2023) for medical mangement of chronic issues.fasting labs on 08/15/2022 one month for weight check .   Sandrea Hughs, NP

## 2022-08-15 ENCOUNTER — Other Ambulatory Visit: Payer: 59

## 2022-08-15 DIAGNOSIS — F988 Other specified behavioral and emotional disorders with onset usually occurring in childhood and adolescence: Secondary | ICD-10-CM

## 2022-08-15 DIAGNOSIS — E782 Mixed hyperlipidemia: Secondary | ICD-10-CM

## 2022-08-19 ENCOUNTER — Telehealth: Payer: Self-pay | Admitting: *Deleted

## 2022-08-19 ENCOUNTER — Other Ambulatory Visit: Payer: Self-pay | Admitting: *Deleted

## 2022-08-19 DIAGNOSIS — F988 Other specified behavioral and emotional disorders with onset usually occurring in childhood and adolescence: Secondary | ICD-10-CM

## 2022-08-19 LAB — COMPLETE METABOLIC PANEL WITH GFR
AG Ratio: 1.4 (calc) (ref 1.0–2.5)
ALT: 14 U/L (ref 6–29)
AST: 16 U/L (ref 10–35)
Albumin: 4 g/dL (ref 3.6–5.1)
Alkaline phosphatase (APISO): 79 U/L (ref 37–153)
BUN: 13 mg/dL (ref 7–25)
CO2: 25 mmol/L (ref 20–32)
Calcium: 8.8 mg/dL (ref 8.6–10.4)
Chloride: 106 mmol/L (ref 98–110)
Creat: 0.54 mg/dL (ref 0.50–1.05)
Globulin: 2.8 g/dL (calc) (ref 1.9–3.7)
Glucose, Bld: 95 mg/dL (ref 65–99)
Potassium: 4.5 mmol/L (ref 3.5–5.3)
Sodium: 139 mmol/L (ref 135–146)
Total Bilirubin: 0.3 mg/dL (ref 0.2–1.2)
Total Protein: 6.8 g/dL (ref 6.1–8.1)
eGFR: 105 mL/min/{1.73_m2} (ref >=60)

## 2022-08-19 LAB — DRUG TOXICOLOGY MONITORING BASE PANEL,W/CONFIRM,ORAL FLD
Amphetamine: 63 ng/mL — ABNORMAL HIGH (ref <10)
Amphetamines: POSITIVE ng/mL — AB (ref <10)
Benzodiazepines: NEGATIVE ng/mL (ref <0.50)
Buprenorphine: NEGATIVE ng/mL (ref <0.10)
Cocaine: NEGATIVE ng/mL (ref <5.0)
Fentanyl: NEGATIVE ng/mL (ref <0.10)
Heroin Metabolite: NEGATIVE ng/mL (ref <1.0)
MARIJUANA: NEGATIVE ng/mL (ref <2.5)
Methadone: NEGATIVE ng/mL (ref <5.0)
Methamphetamine: NEGATIVE ng/mL (ref <10)
Opiates: NEGATIVE ng/mL (ref <2.5)
Tapentadol: NEGATIVE ng/mL (ref <5.0)
Tramadol: NEGATIVE ng/mL (ref <5.0)

## 2022-08-19 LAB — TSH: TSH: 1.56 mIU/L (ref 0.40–4.50)

## 2022-08-19 LAB — CBC WITH DIFFERENTIAL/PLATELET
Absolute Monocytes: 358 cells/uL (ref 200–950)
Basophils Absolute: 50 cells/uL (ref 0–200)
Basophils Relative: 0.9 %
Eosinophils Absolute: 171 cells/uL (ref 15–500)
Eosinophils Relative: 3.1 %
HCT: 44.2 % (ref 35.0–45.0)
Hemoglobin: 14.9 g/dL (ref 11.7–15.5)
Lymphs Abs: 1612 cells/uL (ref 850–3900)
MCH: 29.1 pg (ref 27.0–33.0)
MCHC: 33.7 g/dL (ref 32.0–36.0)
MCV: 86.3 fL (ref 80.0–100.0)
MPV: 9.8 fL (ref 7.5–12.5)
Monocytes Relative: 6.5 %
Neutro Abs: 3311 cells/uL (ref 1500–7800)
Neutrophils Relative %: 60.2 %
Platelets: 277 10*3/uL (ref 140–400)
RBC: 5.12 10*6/uL — ABNORMAL HIGH (ref 3.80–5.10)
RDW: 13.2 % (ref 11.0–15.0)
Total Lymphocyte: 29.3 %
WBC: 5.5 10*3/uL (ref 3.8–10.8)

## 2022-08-19 LAB — LIPID PANEL
Cholesterol: 216 mg/dL — ABNORMAL HIGH (ref <200)
HDL: 48 mg/dL — ABNORMAL LOW (ref >=50)
LDL Cholesterol (Calc): 141 mg/dL (calc) — ABNORMAL HIGH
Non-HDL Cholesterol (Calc): 168 mg/dL (calc) — ABNORMAL HIGH (ref <130)
Total CHOL/HDL Ratio: 4.5 (calc) (ref <5.0)
Triglycerides: 148 mg/dL (ref <150)

## 2022-08-19 NOTE — Telephone Encounter (Signed)
Patient called and stated that pharmacy did not receive the Adderall Rx that was sent on 08/13/2022.  I called pharmacy, [facility] and spoke with [facility] and she stated that the Rx was canceled before ever being sent to their pharmacy and shows inactive. Our end shows confirmed but she stated that we would need to send a new Rx.   Rx Pended and sent to [facility] for approval.

## 2022-08-19 NOTE — Telephone Encounter (Signed)
[facility] faxed Prior Authorization back stating that the Ozempic was DENIED.   Denial letter forwarded to [facility]

## 2022-08-19 NOTE — Telephone Encounter (Signed)
Received Prior Authorization for patient's Semaglutide.  Initiated through [facility] and wnet into Determination.  Awaiting Response from [employer] [phone]  WHQ:[id number]

## 2022-08-21 MED ORDER — AMPHETAMINE-DEXTROAMPHETAMINE 20 MG PO TABS: 20.0000 mg | ORAL_TABLET | Freq: Two times a day (BID) | ORAL | 0 refills | Status: DC

## 2022-08-21 NOTE — Telephone Encounter (Signed)
Adderrall script send to pharmacy .

## 2022-09-09 LAB — COLOGUARD: COLOGUARD: POSITIVE — AB

## 2022-09-10 ENCOUNTER — Encounter: Payer: Self-pay | Admitting: Family

## 2022-09-10 ENCOUNTER — Telehealth: Payer: Self-pay

## 2022-09-10 ENCOUNTER — Ambulatory Visit (INDEPENDENT_AMBULATORY_CARE_PROVIDER_SITE_OTHER): Payer: 59 | Admitting: Family

## 2022-09-10 VITALS — BP 120/78 | HR 86 | Temp 97.5°F | Resp 18 | Ht 70.0 in | Wt 287.6 lb

## 2022-09-10 DIAGNOSIS — Z6841 Body Mass Index (BMI) 40.0 and over, adult: Secondary | ICD-10-CM | POA: Diagnosis not present

## 2022-09-10 DIAGNOSIS — R195 Other fecal abnormalities: Secondary | ICD-10-CM | POA: Diagnosis not present

## 2022-09-10 MED ORDER — SEMAGLUTIDE (1 MG/DOSE) 4 MG/3ML ~~LOC~~ SOPN
1.0000 mg | PEN_INJECTOR | SUBCUTANEOUS | 0 refills | Status: DC
Start: 2022-09-10 — End: 2022-10-15

## 2022-09-10 NOTE — Telephone Encounter (Signed)
Fax received from pharmacy to initiate a [place] for [place].  PA completed via Covermymeds. Awaiting reply from insurance company

## 2022-09-10 NOTE — Progress Notes (Signed)
Provider: Marlowe Sax FNP-C  Dhani Dannemiller, Nelda Bucks, NP  Patient Care Team: Beecher Furio, Nelda Bucks, NP as PCP - General (Family Medicine)  Extended Emergency Contact Information Primary Emergency Contact: McDough,Peg Address: [place] [address] [place] Phone: [phone] Relation: Sister  Code Status:  Full Code  Goals of care: Advanced Directive information    08/13/2022    8:51 AM  Advanced Directives  Does Patient Have a Medical Advance Directive? No  Would patient like information on creating a medical advance directive? No - Patient declined     Chief Complaint  Patient presents with   Follow-up    Patient is being seen for 37M weight check    HPI:  Pt is a 62 y.o. female seen today for an acute visit for weight evaluation.she was here 08/13/2022 BMI was 41.67 requested medication to help with weight management since she had tried diet without any results.She was started on semaglutide o.25 mg weekly then advised to follow up today.  States did well with semaglutide.did not have any side effects except noticed slight constipation for few days which improves when she drinks water.states has not been snacking like she used to.Eats three meals whenever she is at work but not the weekends.  Weight down to 287 lbs from 290 lbs.will increase dose this visit.   Past Medical History:  Diagnosis Date   ADD (attention deficit disorder) 05/02/2014   Allergy    Mixed hyperlipidemia 08/20/2016   Obesity 05/02/2014   Past Surgical History:  Procedure Laterality Date   COSMETIC SURGERY     GASTRIC BYPASS  2002    No Known Allergies  Outpatient Encounter Medications as of 09/10/2022  Medication Sig   amphetamine-dextroamphetamine (ADDERALL) 20 MG tablet Take 1 tablet (20 mg total) by mouth 2 (two) times daily.   ibuprofen (ADVIL,MOTRIN) 200 MG tablet Take 200 mg by mouth as needed.    Multiple Vitamin (MULTIVITAMIN) tablet Take 1 tablet  by mouth daily.   OVER THE COUNTER MEDICATION Vitamin D 3, One capsule daily.   OVER THE COUNTER MEDICATION B12 vitamin tablet once daily   Probiotic Product (PROBIOTIC PO) Take 1 capsule by mouth as needed.   Semaglutide,0.25 or 0.'5MG'$ /DOS, 2 MG/3ML SOPN Inject 0.5 mg into the skin once a week.   No facility-administered encounter medications on file as of 09/10/2022.    Review of Systems  Constitutional:  Negative for appetite change, chills, fatigue, fever and unexpected weight change.  HENT:  Negative for congestion, dental problem, ear discharge, ear pain, facial swelling, hearing loss, nosebleeds, postnasal drip, rhinorrhea, sinus pressure, sinus pain, sneezing, sore throat, tinnitus and trouble swallowing.   Eyes:  Negative for pain, discharge, redness, itching and visual disturbance.  Respiratory:  Negative for cough, chest tightness, shortness of breath and wheezing.   Cardiovascular:  Negative for chest pain, palpitations and leg swelling.  Gastrointestinal:  Negative for abdominal distention, abdominal pain, blood in stool, constipation, diarrhea, nausea and vomiting.  Endocrine: Negative for cold intolerance, heat intolerance, polydipsia, polyphagia and polyuria.  Genitourinary:  Negative for difficulty urinating, dysuria, flank pain, frequency and urgency.  Musculoskeletal:  Negative for arthralgias, back pain, gait problem, joint swelling, myalgias, neck pain and neck stiffness.  Skin:  Negative for color change, pallor, rash and wound.  Neurological:  Negative for dizziness, syncope, speech difficulty, weakness, light-headedness, numbness and headaches.  Hematological:  Does not bruise/bleed easily.  Psychiatric/Behavioral:  Negative for agitation, behavioral problems, confusion, hallucinations, self-injury, sleep disturbance and suicidal ideas. The patient is not nervous/anxious.     Immunization History  Administered Date(s) Administered   Influenza,inj,Quad PF,6+ Mos  05/11/2015, 08/20/2016, 05/21/2017, 06/09/2018, 06/28/2019, 08/06/2021   Influenza-Unspecified 06/13/2022   Moderna Sars-Covid-2 Vaccination 10/14/2019, 11/11/2019   Tdap 07/14/2008, 09/17/2020, 01/08/2022   Tetanus 01/08/2022   Zoster Recombinat (Shingrix) 01/08/2022   Pertinent  Health Maintenance Due  Topic Date Due   COLONOSCOPY (Pts 45-43yr Insurance coverage will need to be confirmed)  Never done   PAP SMEAR-Modifier  09/18/2023   MAMMOGRAM  10/01/2023   INFLUENZA VACCINE  Completed      02/28/2020    8:43 AM 11/07/2020    7:58 AM 08/06/2021    8:29 AM 02/07/2022    8:43 AM 08/13/2022    8:51 AM  Fall Risk  Falls in the past year? 0 0 0 0 0  Was there an injury with Fall? 0 0 0 0 0  Fall Risk Category Calculator 0 0 0 0 0  Fall Risk Category (Retired) Low Low Low Low   (RETIRED) Patient Fall Risk Level Low fall risk Low fall risk Low fall risk Low fall risk   Patient at Risk for Falls Due to   No Fall Risks No Fall Risks History of fall(s)  Fall risk Follow up   Falls evaluation completed Falls evaluation completed Falls evaluation completed   Functional Status Survey:    Vitals:   09/10/22 0926  BP: 120/78  Pulse: 86  Resp: 18  Temp: (!) 97.5 F (36.4 C)  SpO2: 96%  Weight: 287 lb 9.6 oz (130.5 kg)  Height: '5\' 10"'$  (1.778 m)   Body mass index is 41.27 kg/m. Physical Exam Vitals reviewed.  Constitutional:      General: She is not in acute distress.    Appearance: Normal appearance. She is morbidly obese. She is not ill-appearing or diaphoretic.  HENT:     Head: Normocephalic.  Eyes:     General: No scleral icterus.       Right eye: No discharge.        Left eye: No discharge.     Conjunctiva/sclera: Conjunctivae normal.     Pupils: Pupils are equal, round, and reactive to light.  Neck:     Vascular: No carotid bruit.  Cardiovascular:     Rate and Rhythm: Normal rate and regular rhythm.     Pulses: Normal pulses.     Heart sounds: Normal heart sounds.  No murmur heard.    No friction rub. No gallop.  Pulmonary:     Effort: Pulmonary effort is normal. No respiratory distress.     Breath sounds: Normal breath sounds. No wheezing, rhonchi or rales.  Chest:     Chest wall: No tenderness.  Abdominal:     General: Bowel sounds are normal. There is no distension.     Palpations: Abdomen is soft. There is no mass.     Tenderness: There is no abdominal tenderness. There is no right CVA tenderness, left CVA tenderness, guarding or rebound.  Musculoskeletal:        General: No swelling or tenderness. Normal range of motion.     Cervical back: Normal range of motion. No rigidity or tenderness.     Right lower leg: No edema.     Left lower leg: No edema.  Lymphadenopathy:     Cervical: No cervical adenopathy.  Skin:    General: Skin is warm  and dry.     Coloration: Skin is not pale.     Findings: No bruising, erythema, lesion or rash.  Neurological:     Mental Status: She is alert and oriented to person, place, and time.     Cranial Nerves: No cranial nerve deficit.     Sensory: No sensory deficit.     Motor: No weakness.     Coordination: Coordination normal.     Gait: Gait normal.  Psychiatric:        Mood and Affect: Mood normal.        Speech: Speech normal.        Behavior: Behavior normal.        Thought Content: Thought content normal.        Judgment: Judgment normal.    Labs reviewed: Recent Labs    02/12/22 0803 08/15/22 0816  NA 137 139  K 4.3 4.5  CL 102 106  CO2 27 25  GLUCOSE 91 95  BUN 12 13  CREATININE 0.68 0.54  CALCIUM 8.8 8.8   Recent Labs    02/12/22 0803 08/15/22 0816  AST 13 16  ALT 13 14  BILITOT 0.4 0.3  PROT 6.4 6.8   Recent Labs    02/12/22 0803 08/15/22 0816  WBC 4.9 5.5  NEUTROABS 2,773 3,311  HGB 14.4 14.9  HCT 43.5 44.2  MCV 87.9 86.3  PLT 243 277   Lab Results  Component Value Date   TSH 1.56 08/15/2022   Lab Results  Component Value Date   HGBA1C 5.0 02/28/2020   Lab  Results  Component Value Date   CHOL 216 (H) 08/15/2022   HDL 48 (L) 08/15/2022   LDLCALC 141 (H) 08/15/2022   TRIG 148 08/15/2022   CHOLHDL 4.5 08/15/2022    Significant Diagnostic Results in last 30 days:  No results found.  Assessment/Plan  1. Positive colorectal cancer screening using Cologuard test Cologuard results was positive results discussed during visit. Asymptomatic  Will send urgent referral to GI aware specialist office will call to schedule appointment. - Ambulatory referral to Gastroenterology  2. Body mass index (BMI) of 40.0 to 44.9 in adult (HCC) BMI 41.27  Tolerating semaglutide 0.5 mg injection will increase to 1 mg  - Semaglutide, 1 MG/DOSE, 4 MG/3ML SOPN; Inject 1 mg as directed once a week.  Dispense: 3 mL; Refill: 0  Family/ staff Communication: Reviewed plan of care with patient verbalized understanding   Labs/tests ordered: None   Next Appointment: Return if symptoms worsen or fail to improve.   Sandrea Hughs, NP

## 2022-09-11 NOTE — Telephone Encounter (Signed)
Received fax from [facility] stating that they have DENIED the [place].   Fax forwarded to [facility].

## 2022-09-17 ENCOUNTER — Other Ambulatory Visit: Payer: Self-pay

## 2022-09-17 ENCOUNTER — Other Ambulatory Visit: Payer: Self-pay | Admitting: Family

## 2022-09-17 DIAGNOSIS — F988 Other specified behavioral and emotional disorders with onset usually occurring in childhood and adolescence: Secondary | ICD-10-CM

## 2022-09-17 MED ORDER — AMPHETAMINE-DEXTROAMPHETAMINE 20 MG PO TABS: 20.0000 mg | ORAL_TABLET | Freq: Two times a day (BID) | ORAL | 0 refills | Status: DC

## 2022-09-17 NOTE — Telephone Encounter (Signed)
Adderral prescription send to [facility] as requested

## 2022-09-17 NOTE — Telephone Encounter (Signed)
Patient request refill on adderall. Medication last refilled 08/21/2022. Patient has treatment agreement on file. Patient request that medication be sent to [facility] because she is longer using [facility] (pharmacy listed on treatment agreement). Treatment agreement dated 02/08/2023  Medication pended and sent to Marlowe Sax, NP

## 2022-09-24 ENCOUNTER — Other Ambulatory Visit: Payer: Self-pay

## 2022-09-24 ENCOUNTER — Encounter: Payer: Self-pay | Admitting: Internal Medicine

## 2022-09-24 ENCOUNTER — Ambulatory Visit (AMBULATORY_SURGERY_CENTER): Payer: 59

## 2022-09-24 VITALS — Ht 70.0 in | Wt 280.0 lb

## 2022-09-24 DIAGNOSIS — R195 Other fecal abnormalities: Secondary | ICD-10-CM

## 2022-09-24 DIAGNOSIS — Z1211 Encounter for screening for malignant neoplasm of colon: Secondary | ICD-10-CM

## 2022-09-24 MED ORDER — NA SULFATE-K SULFATE-MG SULF 17.5-3.13-1.6 GM/177ML PO SOLN: 1.0000 | Freq: Once | ORAL | 0 refills | Status: AC

## 2022-09-24 NOTE — Progress Notes (Signed)
Denies allergies to eggs or soy products. Denies complication of anesthesia or sedation. Denies use of weight loss medication. Denies use of O2.   Emmi instructions given for colonoscopy.  

## 2022-10-15 ENCOUNTER — Telehealth: Payer: Self-pay

## 2022-10-15 ENCOUNTER — Encounter: Payer: Self-pay | Admitting: Internal Medicine

## 2022-10-15 ENCOUNTER — Other Ambulatory Visit: Payer: Self-pay | Admitting: Family

## 2022-10-15 ENCOUNTER — Ambulatory Visit (AMBULATORY_SURGERY_CENTER): Payer: 59 | Admitting: Internal Medicine

## 2022-10-15 VITALS — BP 148/86 | HR 75 | Temp 98.9°F | Resp 11 | Ht 70.0 in | Wt 280.0 lb

## 2022-10-15 DIAGNOSIS — R195 Other fecal abnormalities: Secondary | ICD-10-CM

## 2022-10-15 DIAGNOSIS — D128 Benign neoplasm of rectum: Secondary | ICD-10-CM

## 2022-10-15 DIAGNOSIS — Z1211 Encounter for screening for malignant neoplasm of colon: Secondary | ICD-10-CM | POA: Diagnosis present

## 2022-10-15 DIAGNOSIS — Z538 Procedure and treatment not carried out for other reasons: Secondary | ICD-10-CM | POA: Diagnosis not present

## 2022-10-15 DIAGNOSIS — Z6841 Body Mass Index (BMI) 40.0 and over, adult: Secondary | ICD-10-CM

## 2022-10-15 DIAGNOSIS — Z86010 Personal history of colonic polyps: Secondary | ICD-10-CM | POA: Insufficient documentation

## 2022-10-15 MED ORDER — SODIUM CHLORIDE 0.9 % IV SOLN: 500.0000 mL | INTRAVENOUS | Status: DC

## 2022-10-15 NOTE — Progress Notes (Signed)
Dr. Carlean Purl spoke with patient decided to wait for result of polyp before reschedule.

## 2022-10-15 NOTE — Patient Instructions (Addendum)
I found and removed a medium-sized polyp that was a possible cause of the abnormal Cologuard test.  The colonoscopy pre did not work as well as desired so the exam was incomplete, plus the colon is elongated and what we call redundant =- and combined with the prep issues and that am not certain I reached the beginning of the colon.  We ned to repeat a colonoscopy - ? tomorrow if able if not will pick another date later.  I appreciate the opportunity to care for you. Gatha Mayer, MD, [facility]   Discharge instructions given. Handout on polyp. Resume previous medications. Dr. Carlean Purl office will call to reschedule procedure after result of polyp. YOU HAD AN ENDOSCOPIC PROCEDURE TODAY AT [facility]:   Refer to the procedure report that was given to you for any specific questions about what was found during the examination.  If the procedure report does not answer your questions, please call your gastroenterologist to clarify.  If you requested that your care partner not be given the details of your procedure findings, then the procedure report has been included in a sealed envelope for you to review at your convenience later.  YOU SHOULD EXPECT: Some feelings of bloating in the abdomen. Passage of more gas than usual.  Walking can help get rid of the air that was put into your GI tract during the procedure and reduce the bloating. If you had a lower endoscopy (such as a colonoscopy or flexible sigmoidoscopy) you may notice spotting of blood in your stool or on the toilet paper. If you underwent a bowel prep for your procedure, you may not have a normal bowel movement for a few days.  Please Note:  You might notice some irritation and congestion in your nose or some drainage.  This is from the oxygen used during your procedure.  There is no need for concern and it should clear up in a day or so.  SYMPTOMS TO REPORT IMMEDIATELY:  Following lower endoscopy (colonoscopy or flexible  sigmoidoscopy):  Excessive amounts of blood in the stool  Significant tenderness or worsening of abdominal pains  Swelling of the abdomen that is new, acute  Fever of 100F or higher   For urgent or emergent issues, a gastroenterologist can be reached at any hour by calling [phone]. Do not use MyChart messaging for urgent concerns.    DIET:  We do recommend a small meal at first, but then you may proceed to your regular diet.  Drink plenty of fluids but you should avoid alcoholic beverages for 24 hours.  ACTIVITY:  You should plan to take it easy for the rest of today and you should NOT DRIVE or use heavy machinery until tomorrow (because of the sedation medicines used during the test).    FOLLOW UP: Our staff will call the number listed on your records the next business day following your procedure.  We will call around 7:15- 8:00 am to check on you and address any questions or concerns that you may have regarding the information given to you following your procedure. If we do not reach you, we will leave a message.     If any biopsies were taken you will be contacted by phone or by letter within the next 1-3 weeks.  Please call us at [phone] if you have not heard about the biopsies in 3 weeks.    SIGNATURES/CONFIDENTIALITY: You and/or your care partner have signed paperwork which will be entered  into your electronic medical record.  These signatures attest to the fact that that the information above on your After Visit Summary has been reviewed and is understood.  Full responsibility of the confidentiality of this discharge information lies with you and/or your care-partner.

## 2022-10-15 NOTE — Op Note (Addendum)
[facility] Patient Name: Alexandra Proctor Procedure Date: 10/15/2022 9:07 AM MRN: [id number] Endoscopist: Gatha Mayer , MD, [phone] Age: 62 Referring MD:  Date of Birth: Apr 17, 1961 Gender: Female Account #: [id number] Procedure:                Colonoscopy Indications:              Positive Cologuard test Medicines:                Monitored Anesthesia Care Procedure:                Pre-Anesthesia Assessment:                           - Prior to the procedure, a History and Physical                            was performed, and patient medications and                            allergies were reviewed. The patient's tolerance of                            previous anesthesia was also reviewed. The risks                            and benefits of the procedure and the sedation                            options and risks were discussed with the patient.                            All questions were answered, and informed consent                            was obtained. Prior Anticoagulants: The patient has                            taken no anticoagulant or antiplatelet agents. ASA                            Grade Assessment: III - A patient with severe                            systemic disease. After reviewing the risks and                            benefits, the patient was deemed in satisfactory                            condition to undergo the procedure.                           After obtaining informed consent, the colonoscope  was passed under direct vision. Throughout the                            procedure, the patient's blood pressure, pulse, and                            oxygen saturations were monitored continuously. The                            CF [id number] was introduced through the anus                            and advanced to the the ascending colon for                            evaluation. This was the intended  extent. The                            colonoscopy was performed with difficulty due to                            inadequate bowel prep, a redundant colon and                            significant looping. The patient tolerated the                            procedure well. The quality of the bowel                            preparation was fair. The bowel preparation used                            was SUPREP via split dose instruction. The rectum                            was photographed. Scope In: 9:14:19 AM Scope Out: 9:36:16 AM Scope Withdrawal Time: 0 hours 0 minutes 3 seconds  Total Procedure Duration: 0 hours 21 minutes 57 seconds  Findings:                 The perianal and digital rectal examinations were                            normal.                           A 15 mm polyp was found in the rectum. The polyp                            was sessile. The polyp was removed with a piecemeal                            technique using a cold snare. Resection and  retrieval were complete. Verification of patient                            identification for the specimen was done. Estimated                            blood loss was minimal.                           The colon (entire examined portion) was                            significantly redundant.                           Semi-liquid semi-solid stool was found in the                            entire colon, interfering with visualization.                            Lavage of the area was performed, resulting in                            incomplete clearance with fair visualization.                           The exam was otherwise without abnormality on                            direct and retroflexion views. Complications:            No immediate complications. Estimated Blood Loss:     Estimated blood loss was minimal. Impression:               Did not reach cecum -                            - Preparation of the colon was fair.                           - One 15 mm polyp in the rectum, removed piecemeal                            using a cold snare. Resected and retrieved.                           - Redundant colon.                           - Stool in the entire examined colon.                           - The examination was otherwise normal on direct  and retroflexion views. Recommendation:           - Patient has a contact number available for                            emergencies. The signs and symptoms of potential                            delayed complications were discussed with the                            patient. Return to normal activities tomorrow.                            Written discharge instructions were provided to the                            patient.                           - Await pathology results.                           - Repeat colonoscopy soon-to get adequate exam of                            colon. Will see if she can come tomorrow if not                            will pick another date and do double prep. USE                            ABDOMINAL BINDER                           She cannot do tomorrow - have decided to wait for                            pathology and regroup/reschedule after that -                            suspect she needs modified double prep - thinking                            MiraLAx purge + Plenvu Gatha Mayer, MD 10/15/2022 9:48:01 AM This report has been signed electronically.

## 2022-10-15 NOTE — Progress Notes (Signed)
A and O x3. Report to RN. Tolerated MAC anesthesia well. 

## 2022-10-15 NOTE — Progress Notes (Signed)
[facility] History and Physical   Primary Care Physician:  Ngetich, Nelda Bucks, NP   Reason for Procedure:   Cologuard +  Plan:    colonoscopy     HPI: Alexandra Proctor is a 62 y.o. female for evaluation of Cologuard +   Past Medical History:  Diagnosis Date   ADD (attention deficit disorder) 05/02/2014   Allergy    Anemia    GERD (gastroesophageal reflux disease)    Mixed hyperlipidemia 08/20/2016   Obesity 05/02/2014    Past Surgical History:  Procedure Laterality Date   COSMETIC SURGERY     GASTRIC BYPASS  2002    Prior to Admission medications   Medication Sig Start Date End Date Taking? Authorizing Provider  amphetamine-dextroamphetamine (ADDERALL) 20 MG tablet Take 1 tablet (20 mg total) by mouth 2 (two) times daily. 09/17/22  Yes Ngetich, Dinah C, NP  ibuprofen (ADVIL,MOTRIN) 200 MG tablet Take 200 mg by mouth as needed.     [provider]  Multiple Vitamin (MULTIVITAMIN) tablet Take 1 tablet by mouth daily.    [provider]  OVER THE COUNTER MEDICATION Vitamin D 3, One capsule daily.    [provider]  OVER THE COUNTER MEDICATION B12 vitamin tablet once daily Patient not taking: Reported on 09/24/2022    [provider]  Probiotic Product (PROBIOTIC PO) Take 1 capsule by mouth as needed.    [provider]  Semaglutide, 1 MG/DOSE, 4 MG/3ML SOPN Inject 1 mg as directed once a week. 09/10/22   Ngetich, Nelda Bucks, NP    Current Outpatient Medications  Medication Sig Dispense Refill   amphetamine-dextroamphetamine (ADDERALL) 20 MG tablet Take 1 tablet (20 mg total) by mouth 2 (two) times daily. 60 tablet 0   ibuprofen (ADVIL,MOTRIN) 200 MG tablet Take 200 mg by mouth as needed.      Multiple Vitamin (MULTIVITAMIN) tablet Take 1 tablet by mouth daily.     OVER THE COUNTER MEDICATION Vitamin D 3, One capsule daily.     OVER THE COUNTER MEDICATION B12 vitamin tablet once daily (Patient not taking: Reported on  09/24/2022)     Probiotic Product (PROBIOTIC PO) Take 1 capsule by mouth as needed.     Semaglutide, 1 MG/DOSE, 4 MG/3ML SOPN Inject 1 mg as directed once a week. 3 mL 0   Current Facility-Administered Medications  Medication Dose Route Frequency Provider Last Rate Last Admin   0.9 %  sodium chloride infusion  500 mL Intravenous Continuous Gatha Mayer, MD        Allergies as of 10/15/2022   (No Known Allergies)    Family History  Problem Relation Age of Onset   Breast cancer Mother        diagnosed in 65s   Stroke Mother    Cancer Father    Breast cancer Maternal Grandmother    Breast cancer Paternal Grandmother    Asthma Brother    Colon cancer Neg Hx    Esophageal cancer Neg Hx    Rectal cancer Neg Hx    Stomach cancer Neg Hx     Social History   Socioeconomic History   Marital status: Divorced    Spouse name: Not on file   Number of children: Not on file   Years of education: Not on file   Highest education level: Not on file  Occupational History   Not on file  Tobacco Use   Smoking status: Never   Smokeless tobacco: Never  Vaping Use  Vaping Use: Never used  Substance and Sexual Activity   Alcohol use: Yes    Alcohol/week: 0.0 standard drinks of alcohol    Comment: 1 drink a month.   Drug use: Never   Sexual activity: Not Currently    Birth control/protection: Post-menopausal  Other Topics Concern   Not on file  Social History Narrative   Yes patient drinks/eats things containing caffeine    Patient lives in an apartment- one or more stories (1 person), no pets    Patient exercises (walking 3-5 times weekly)                       Social Determinants of Health   Financial Resource Strain: Not on file  Food Insecurity: Not on file  Transportation Needs: Not on file  Physical Activity: Not on file  Stress: Not on file  Social Connections: Not on file  Intimate Partner Violence: Not on file    Review of Systems:  All other review of  systems negative except as mentioned in the HPI.  Physical Exam: Vital signs BP 134/72   Pulse 95   Temp 98.9 F (37.2 C)   Ht 5\' 10"  (1.778 m)   Wt 280 lb (127 kg)   SpO2 95%   BMI 40.18 kg/m   General:   Alert,  Well-developed, well-nourished, pleasant and cooperative in NAD Lungs:  Clear throughout to auscultation.   Heart:  Regular rate and rhythm; no murmurs, clicks, rubs,  or gallops. Abdomen:  Soft, nontender and nondistended. Normal bowel sounds.   Neuro/Psych:  Alert and cooperative. Normal mood and affect. A and O x 3   @Cord Wilczynski  Simonne Maffucci, MD, [facility]astroenterology [phone] (pager) 10/15/2022 9:06 AM@

## 2022-10-15 NOTE — Telephone Encounter (Signed)
Incoming fax received from [employer] to initiate a prior authorization. PA initiated via Covermymeds.   Key: [id number]    Awaiting for response from insurance company

## 2022-10-15 NOTE — Progress Notes (Signed)
Called to room to assist during endoscopic procedure.  Patient ID and intended procedure confirmed with present staff. Received instructions for my participation in the procedure from the performing physician.  

## 2022-10-16 ENCOUNTER — Telehealth: Payer: Self-pay

## 2022-10-16 NOTE — Telephone Encounter (Signed)
  Follow up Call-     10/15/2022    8:12 AM  Call back number  Post procedure Call Back phone  # [phone]  Permission to leave phone message Yes     Patient questions:  Do you have a fever, pain , or abdominal swelling? No. Pain Score  0 *  Have you tolerated food without any problems? Yes.    Have you been able to return to your normal activities? Yes.    Do you have any questions about your discharge instructions: Diet   No. Medications  No. Follow up visit  No.  Do you have questions or concerns about your Care? No.  Actions: * If pain score is 4 or above: No action needed, pain <4.

## 2022-10-20 ENCOUNTER — Other Ambulatory Visit: Payer: Self-pay

## 2022-10-20 DIAGNOSIS — F988 Other specified behavioral and emotional disorders with onset usually occurring in childhood and adolescence: Secondary | ICD-10-CM

## 2022-10-20 MED ORDER — AMPHETAMINE-DEXTROAMPHETAMINE 20 MG PO TABS
20.0000 mg | ORAL_TABLET | Freq: Two times a day (BID) | ORAL | 0 refills | Status: DC
Start: 2022-10-20 — End: 2022-11-24

## 2022-10-20 NOTE — Telephone Encounter (Signed)
Adderall refilled

## 2022-10-20 NOTE — Telephone Encounter (Signed)
From: Sherrill Raring To: Office of Caesar Bookman, NP Sent: 10/20/2022 8:08 AM EDT Subject: Medication Renewal Request  Refills have been requested for the following medications:   amphetamine-dextroamphetamine (ADDERALL) 20 MG tablet [Dinah C Ngetich]  Preferred pharmacy: [facility] [id number] - [place], [place] - [address] Delivery method: [employer]

## 2022-10-20 NOTE — Telephone Encounter (Signed)
Patient has request refill on medication Adderall. Patient has up to date Non Opioid Contract on file. Patient medication pend and sent to PCP Ngetich, Donalee Citrin, NP for approval.

## 2022-10-24 NOTE — Telephone Encounter (Signed)
Received fax from [facility] and Ozempic was DENIED. Was determined as not medically necessary.   Fax forwarded to [employer].

## 2022-10-27 ENCOUNTER — Encounter: Payer: Self-pay | Admitting: Internal Medicine

## 2022-10-27 DIAGNOSIS — Z86010 Personal history of colonic polyps: Secondary | ICD-10-CM

## 2022-10-27 HISTORY — DX: Personal history of colonic polyps: Z86.010

## 2022-11-15 ENCOUNTER — Other Ambulatory Visit: Payer: Self-pay | Admitting: Family

## 2022-11-15 DIAGNOSIS — Z6841 Body Mass Index (BMI) 40.0 and over, adult: Secondary | ICD-10-CM

## 2022-11-19 ENCOUNTER — Telehealth: Payer: Self-pay

## 2022-11-19 NOTE — Telephone Encounter (Signed)
Incoming fax received from Karin Golden to initiate a PA for via COVERmymeds for Ozempic.  We did a PA for this medication on 10/15/2022 and it was denied, see media tab.  Left message on voicemail for patient to return call when available to further discuss request. We need to find out if patient has changed insurance plans or if another PA is preferred by patient. Awaiting a return call.

## 2022-11-24 ENCOUNTER — Other Ambulatory Visit: Payer: Self-pay

## 2022-11-24 DIAGNOSIS — F988 Other specified behavioral and emotional disorders with onset usually occurring in childhood and adolescence: Secondary | ICD-10-CM

## 2022-11-24 NOTE — Telephone Encounter (Signed)
Patient called requesting refill on Adderall. Medication last refilled 10/20/2022. Up to date treatment agreement on file.  Medication pended and sent to Richarda Blade, NP

## 2022-11-25 MED ORDER — AMPHETAMINE-DEXTROAMPHETAMINE 20 MG PO TABS
20.0000 mg | ORAL_TABLET | Freq: Two times a day (BID) | ORAL | 0 refills | Status: DC
Start: 2022-11-25 — End: 2022-12-26

## 2022-11-25 NOTE — Telephone Encounter (Signed)
Adderall refilled

## 2022-11-28 NOTE — Telephone Encounter (Signed)
Another fax was received prompting [place] to complete a prior authorization. I spoke with patient and she confirmed that her insurance plan has not changed since the last PA completed in April. Patient stated she is ok with paying for medication and is aware to call if she would prefer a covered alternative in the future

## 2022-12-16 ENCOUNTER — Other Ambulatory Visit: Payer: Self-pay | Admitting: Family

## 2022-12-16 DIAGNOSIS — Z6841 Body Mass Index (BMI) 40.0 and over, adult: Secondary | ICD-10-CM

## 2022-12-26 ENCOUNTER — Other Ambulatory Visit: Payer: Self-pay

## 2022-12-26 DIAGNOSIS — F988 Other specified behavioral and emotional disorders with onset usually occurring in childhood and adolescence: Secondary | ICD-10-CM

## 2022-12-26 MED ORDER — AMPHETAMINE-DEXTROAMPHETAMINE 20 MG PO TABS
20.0000 mg | ORAL_TABLET | Freq: Two times a day (BID) | ORAL | 0 refills | Status: DC
Start: 2022-12-26 — End: 2023-01-23

## 2022-12-26 NOTE — Telephone Encounter (Signed)
Patient called requesting refill on adderall. Medication last refilled 11/25/22. Treatment agreement on file and up to date.  Medication pended and sent to Richarda Blade, NP

## 2023-01-21 ENCOUNTER — Other Ambulatory Visit: Payer: Self-pay | Admitting: Family

## 2023-01-21 DIAGNOSIS — Z6841 Body Mass Index (BMI) 40.0 and over, adult: Secondary | ICD-10-CM

## 2023-01-23 ENCOUNTER — Other Ambulatory Visit: Payer: Self-pay

## 2023-01-23 DIAGNOSIS — F988 Other specified behavioral and emotional disorders with onset usually occurring in childhood and adolescence: Secondary | ICD-10-CM

## 2023-01-23 MED ORDER — AMPHETAMINE-DEXTROAMPHETAMINE 20 MG PO TABS
20.0000 mg | ORAL_TABLET | Freq: Two times a day (BID) | ORAL | 0 refills | Status: DC
Start: 2023-01-23 — End: 2023-02-19

## 2023-01-23 NOTE — Telephone Encounter (Signed)
Patient called requesting refill on Adderall, Medication last refilled 12/26/22. Treatment agreement  on file and up to date.  Medication has been pended and sent to Richarda Blade, NP

## 2023-01-26 ENCOUNTER — Telehealth: Payer: 59

## 2023-01-26 ENCOUNTER — Other Ambulatory Visit: Payer: Self-pay

## 2023-01-26 NOTE — Telephone Encounter (Signed)
Incoming fax received from patients pharmacy to initiate a prior authorization for                   .  PA initiated through covermymeds. Key: [id number]  Awaiting reply from the insurance company which will be determined in 48-72 hours.

## 2023-01-27 NOTE — Telephone Encounter (Signed)
Status check made on PA via the covermymeds website and the following message was received:    Indicating that a PA was performed recently and denied.  Mychart message sent to patient.

## 2023-02-11 ENCOUNTER — Encounter: Payer: 59 | Admitting: Family

## 2023-02-11 NOTE — Progress Notes (Signed)
  This encounter was created in error - please disregard. No show 

## 2023-02-19 ENCOUNTER — Ambulatory Visit (INDEPENDENT_AMBULATORY_CARE_PROVIDER_SITE_OTHER): Payer: 59 | Admitting: Family

## 2023-02-19 ENCOUNTER — Encounter: Payer: Self-pay | Admitting: Family

## 2023-02-19 VITALS — BP 132/86 | HR 93 | Temp 96.4°F | Resp 18 | Ht 70.0 in | Wt 278.0 lb

## 2023-02-19 DIAGNOSIS — F988 Other specified behavioral and emotional disorders with onset usually occurring in childhood and adolescence: Secondary | ICD-10-CM | POA: Diagnosis not present

## 2023-02-19 DIAGNOSIS — E782 Mixed hyperlipidemia: Secondary | ICD-10-CM | POA: Diagnosis not present

## 2023-02-19 DIAGNOSIS — E669 Obesity, unspecified: Secondary | ICD-10-CM | POA: Diagnosis not present

## 2023-02-19 MED ORDER — WEGOVY 0.5 MG/0.5ML ~~LOC~~ SOAJ: 0.5000 mg | SUBCUTANEOUS | 5 refills | Status: DC

## 2023-02-19 MED ORDER — AMPHETAMINE-DEXTROAMPHETAMINE 20 MG PO TABS
20.0000 mg | ORAL_TABLET | Freq: Two times a day (BID) | ORAL | 0 refills | Status: DC
Start: 2023-02-19 — End: 2023-03-30

## 2023-02-19 NOTE — Progress Notes (Signed)
Provider: Richarda Blade FNP-C   Derhonda Eastlick, Donalee Citrin, NP  Patient Care Team: Electa Sterry, Donalee Citrin, NP as PCP - General (Family Medicine)  Extended Emergency Contact Information Primary Emergency Contact: McDough,Peg Address: [address]          [place], [place] [address] [place] Home Phone: [phone] Relation: Sister  Code Status:  Full Code  Goals of care: Advanced Directive information    08/13/2022    8:51 AM  Advanced Directives  Does Patient Have a Medical Advance Directive? No  Would patient like information on creating a medical advance directive? No - Patient declined     Chief Complaint  Patient presents with   Medical Management of Chronic Issues    Patient is here for a follow up for chronic conditions    Immunizations    Patient is due for updated covid booster, yearly influenza, and second shingrix vaccine    HPI:  Pt is a 62 y.o. female seen today for 6 months follow-up for medical management of chronic diseases.  She denies any acute issues at this visit.  Has been Ozempic but states insurance does not cover would like to try [facility] for weight loss. Her niece recently moved from [place] to live with her.Has been eating much healthy. Has lost 2 lbs over 4 months on low dose Ozempic. She continues to require Adderrall for ADHD especially at work.No side effects reported.   Had colonoscopy but was advised to schedule for repeat. Has had shingles vaccine x 1 dose.Aware to get second dose at the pharmacy. Also advised to get COVID-19 vaccine at the pharmacy.  Will get Influenza vaccine in the fall.   Past Medical History:  Diagnosis Date   ADD (attention deficit disorder) 05/02/2014   Allergy    Anemia    GERD (gastroesophageal reflux disease)    Hx of adenomatous polyp of colon 10/27/2022   Mixed hyperlipidemia 08/20/2016   Obesity 05/02/2014   Past Surgical History:  Procedure Laterality Date   COSMETIC SURGERY     GASTRIC BYPASS  2002     No Known Allergies  Allergies as of 02/19/2023   No Known Allergies      Medication List        Accurate as of February 19, 2023  1:32 PM. If you have any questions, ask your nurse or doctor.          STOP taking these medications    OVER THE COUNTER MEDICATION Stopped by: Donalee Citrin Jase Reep       TAKE these medications    amphetamine-dextroamphetamine 20 MG tablet Commonly known as: ADDERALL Take 1 tablet (20 mg total) by mouth 2 (two) times daily.   ibuprofen 200 MG tablet Commonly known as: ADVIL Take 200 mg by mouth as needed.   multivitamin tablet Take 1 tablet by mouth daily.   OVER THE COUNTER MEDICATION Vitamin D 3, One capsule daily.   Ozempic (1 MG/DOSE) 4 MG/3ML Sopn Generic drug: Semaglutide (1 MG/DOSE) DIAL AND INJECT UNDER THE SKIN 1 MG WEEKLY   PROBIOTIC PO Take 1 capsule by mouth as needed.        Review of Systems  Constitutional:  Negative for appetite change, chills, fatigue, fever and unexpected weight change.  HENT:  Negative for congestion, dental problem, ear discharge, ear pain, facial swelling, hearing loss, nosebleeds, postnasal drip, rhinorrhea, sinus pressure, sinus pain, sneezing, sore throat, tinnitus and trouble swallowing.   Eyes:  Negative for pain, discharge, redness,  itching and visual disturbance.  Respiratory:  Negative for cough, chest tightness, shortness of breath and wheezing.   Cardiovascular:  Negative for chest pain, palpitations and leg swelling.  Gastrointestinal:  Negative for abdominal distention, abdominal pain, blood in stool, constipation, diarrhea, nausea and vomiting.  Endocrine: Negative for cold intolerance, heat intolerance, polydipsia, polyphagia and polyuria.  Genitourinary:  Negative for difficulty urinating, dysuria, flank pain, frequency and urgency.  Musculoskeletal:  Negative for arthralgias, back pain, gait problem, joint swelling, myalgias, neck pain and neck stiffness.  Skin:  Negative for  color change, pallor, rash and wound.  Neurological:  Negative for dizziness, syncope, speech difficulty, weakness, light-headedness, numbness and headaches.  Hematological:  Does not bruise/bleed easily.  Psychiatric/Behavioral:  Positive for decreased concentration. Negative for agitation, behavioral problems, confusion, hallucinations, self-injury, sleep disturbance and suicidal ideas. The patient is not nervous/anxious.        Adderall effective     Immunization History  Administered Date(s) Administered   Influenza,inj,Quad PF,6+ Mos 05/11/2015, 08/20/2016, 05/21/2017, 06/09/2018, 06/28/2019, 08/06/2021   Influenza-Unspecified 06/13/2022   Moderna Sars-Covid-2 Vaccination 10/14/2019, 11/11/2019   Tdap 07/14/2008, 09/17/2020, 01/08/2022   Tetanus 01/08/2022   Zoster Recombinant(Shingrix) 01/08/2022   Pertinent  Health Maintenance Due  Topic Date Due   INFLUENZA VACCINE  02/12/2023   PAP SMEAR-Modifier  09/18/2023   MAMMOGRAM  10/01/2023   Colonoscopy  10/15/2023      11/07/2020    7:58 AM 08/06/2021    8:29 AM 02/07/2022    8:43 AM 08/13/2022    8:51 AM 02/19/2023   11:22 AM  Fall Risk  Falls in the past year? 0 0 0 0 0  Was there an injury with Fall? 0 0 0 0 0  Fall Risk Category Calculator 0 0 0 0 0  Fall Risk Category (Retired) Low Low Low    (RETIRED) Patient Fall Risk Level Low fall risk Low fall risk Low fall risk    Patient at Risk for Falls Due to  No Fall Risks No Fall Risks History of fall(s) No Fall Risks  Fall risk Follow up  Falls evaluation completed Falls evaluation completed Falls evaluation completed Falls evaluation completed   Functional Status Survey:    Vitals:   02/19/23 1121  BP: 132/86  Pulse: 93  Resp: 18  Temp: (!) 96.4 F (35.8 C)  SpO2: 96%  Weight: 278 lb (126.1 kg)  Height: 5\' 10"  (1.778 m)   Body mass index is 39.89 kg/m. Physical Exam Vitals reviewed.  Constitutional:      General: She is not in acute distress.    Appearance:  Normal appearance. She is obese. She is not ill-appearing or diaphoretic.  HENT:     Head: Normocephalic.     Right Ear: Tympanic membrane, ear canal and external ear normal. There is no impacted cerumen.     Left Ear: Tympanic membrane, ear canal and external ear normal. There is no impacted cerumen.     Nose: Nose normal. No congestion or rhinorrhea.     Mouth/Throat:     Mouth: Mucous membranes are moist.     Pharynx: Oropharynx is clear. No oropharyngeal exudate or posterior oropharyngeal erythema.  Eyes:     General: No scleral icterus.       Right eye: No discharge.        Left eye: No discharge.     Extraocular Movements: Extraocular movements intact.     Conjunctiva/sclera: Conjunctivae normal.     Pupils: Pupils are equal, round,  and reactive to light.  Neck:     Vascular: No carotid bruit.  Cardiovascular:     Rate and Rhythm: Normal rate and regular rhythm.     Pulses: Normal pulses.     Heart sounds: Normal heart sounds. No murmur heard.    No friction rub. No gallop.  Pulmonary:     Effort: Pulmonary effort is normal. No respiratory distress.     Breath sounds: Normal breath sounds. No wheezing, rhonchi or rales.  Chest:     Chest wall: No tenderness.  Abdominal:     General: Bowel sounds are normal. There is no distension.     Palpations: Abdomen is soft. There is no mass.     Tenderness: There is no abdominal tenderness. There is no right CVA tenderness, left CVA tenderness, guarding or rebound.  Musculoskeletal:        General: No swelling or tenderness. Normal range of motion.     Cervical back: Normal range of motion. No rigidity or tenderness.     Right lower leg: No edema.     Left lower leg: No edema.  Lymphadenopathy:     Cervical: No cervical adenopathy.  Skin:    General: Skin is warm and dry.     Coloration: Skin is not pale.     Findings: No bruising, erythema, lesion or rash.  Neurological:     Mental Status: She is alert and oriented to person,  place, and time.     Cranial Nerves: No cranial nerve deficit.     Sensory: No sensory deficit.     Motor: No weakness.     Coordination: Coordination normal.     Gait: Gait normal.  Psychiatric:        Mood and Affect: Mood normal.        Speech: Speech normal.        Behavior: Behavior normal.        Thought Content: Thought content normal.        Judgment: Judgment normal.     Labs reviewed: Recent Labs    08/15/22 0816  NA 139  K 4.5  CL 106  CO2 25  GLUCOSE 95  BUN 13  CREATININE 0.54  CALCIUM 8.8   Recent Labs    08/15/22 0816  AST 16  ALT 14  BILITOT 0.3  PROT 6.8   Recent Labs    08/15/22 0816  WBC 5.5  NEUTROABS 3,311  HGB 14.9  HCT 44.2  MCV 86.3  PLT 277   Lab Results  Component Value Date   TSH 1.56 08/15/2022   Lab Results  Component Value Date   HGBA1C 5.0 02/28/2020   Lab Results  Component Value Date   CHOL 216 (H) 08/15/2022   HDL 48 (L) 08/15/2022   LDLCALC 141 (H) 08/15/2022   TRIG 148 08/15/2022   CHOLHDL 4.5 08/15/2022    Significant Diagnostic Results in last 30 days:  No results found.  Assessment/Plan 1. Attention deficit disorder (ADD) without hyperactivity Continue to require Adderall to concentrate at work sits on exercise ball which helps with the fidgeting. - amphetamine-dextroamphetamine (ADDERALL) 20 MG tablet; Take 1 tablet (20 mg total) by mouth 2 (two) times daily.  Dispense: 60 tablet; Refill: 0 - TSH; Future - COMPLETE METABOLIC PANEL WITH GFR; Future - CBC with Differential/Platelet; Future  2. Mixed hyperlipidemia Previous Chol 216 and LDL 141  - continue with dietary modification  - exercise at least three times per week for 30  minutes  - Lipid panel; Future  3. Obesity (BMI 35.0-39.9 without comorbidity) BMI has improved on Ozempic from 41.27 to 39.89  - continue with dietary modification and exercise  - start on Wegovy as above since Ozempic is not covered by insurance  Family/ staff  Communication: Reviewed plan of care with patient verbalized understanding   Labs/tests ordered:  - TSH; Future - COMPLETE METABOLIC PANEL WITH GFR; Future - CBC with Differential/Platelet; Future - Lipid panel; Future  Next Appointment : Return in about 6 months (around 08/22/2023) for medical mangement of chronic issues., fasting labs in one week or sooner .   Caesar Bookman, NP

## 2023-02-23 ENCOUNTER — Telehealth: Payer: Self-pay | Admitting: *Deleted

## 2023-02-23 ENCOUNTER — Other Ambulatory Visit: Payer: 59

## 2023-02-23 NOTE — Telephone Encounter (Signed)
Form filled out and signed and faxed back to [facility] as requested.

## 2023-02-23 NOTE — Telephone Encounter (Signed)
Received fax from [facility] for a Prior Authorization for [facility].   Filled out form and placed in Dinah's folder to finish filling out and sign.   To be faxed back to [facility] Fax:[phone]

## 2023-02-25 ENCOUNTER — Telehealth: Payer: Self-pay

## 2023-02-25 DIAGNOSIS — E669 Obesity, unspecified: Secondary | ICD-10-CM

## 2023-02-25 MED ORDER — SEMAGLUTIDE(0.25 OR 0.5MG/DOS) 2 MG/3ML ~~LOC~~ SOPN
0.5000 mg | PEN_INJECTOR | SUBCUTANEOUS | 3 refills | Status: DC
Start: 2023-02-25 — End: 2023-02-26

## 2023-02-25 NOTE — Telephone Encounter (Signed)
Patient called stating that she though that the dose of ozempic was going to be increased. Patient states that she was told by the pharmacy that ozempic was ready to be picked up,but the dosage wasn't changed. She didn't know that wegovy was sent to pharmacy. She states that she would just rather increase the dose on ozempic. She knows wegovy probably not covered by insurance and thinks it will be more expensive.  There was a prior authorization request received for [facility]  Message sent to Richarda Blade, NP

## 2023-02-25 NOTE — Telephone Encounter (Signed)
Increase to 2 mg injection.

## 2023-02-25 NOTE — Telephone Encounter (Signed)
1.Discontinue Wegovy  2.Increase Ozempic to 0.5 mg injection weekly x 4 weeks then increase to 1 mg injetion

## 2023-02-25 NOTE — Telephone Encounter (Signed)
Patient states that she is taking 1 mg now.

## 2023-02-26 MED ORDER — SEMAGLUTIDE(0.25 OR 0.5MG/DOS) 2 MG/3ML ~~LOC~~ SOPN: 2.0000 mg | PEN_INJECTOR | SUBCUTANEOUS | 3 refills | Status: DC

## 2023-02-26 NOTE — Telephone Encounter (Signed)
Prescription for 2mg  needs to be sent to pharmacy.

## 2023-02-26 NOTE — Telephone Encounter (Signed)
Ozempic 2 mg injection solution prescription send to pharmacy.

## 2023-02-27 ENCOUNTER — Other Ambulatory Visit: Payer: 59

## 2023-02-27 ENCOUNTER — Telehealth: Payer: Self-pay

## 2023-02-27 NOTE — Telephone Encounter (Signed)
Prior authorization request received for Ozempic. Prior authorization initiated through covermy meds. Waiting on reply

## 2023-03-30 ENCOUNTER — Other Ambulatory Visit: Payer: Self-pay | Admitting: *Deleted

## 2023-03-30 DIAGNOSIS — F988 Other specified behavioral and emotional disorders with onset usually occurring in childhood and adolescence: Secondary | ICD-10-CM

## 2023-03-30 MED ORDER — AMPHETAMINE-DEXTROAMPHETAMINE 20 MG PO TABS: 20.0000 mg | ORAL_TABLET | Freq: Two times a day (BID) | ORAL | 0 refills | Status: DC

## 2023-03-30 NOTE — Telephone Encounter (Signed)
Patient called requesting refill on Adderall Epic LR: 02/19/2023 Contract Date: 02/19/2023  Pended Rx and sent to [facility] for approval due to Dinah out of office.

## 2023-05-01 ENCOUNTER — Other Ambulatory Visit: Payer: Self-pay

## 2023-05-01 DIAGNOSIS — F988 Other specified behavioral and emotional disorders with onset usually occurring in childhood and adolescence: Secondary | ICD-10-CM

## 2023-05-01 MED ORDER — AMPHETAMINE-DEXTROAMPHETAMINE 20 MG PO TABS
20.0000 mg | ORAL_TABLET | Freq: Two times a day (BID) | ORAL | 0 refills | Status: DC
Start: 2023-05-01 — End: 2023-05-29

## 2023-05-01 NOTE — Telephone Encounter (Signed)
Patient called and states she needs refill on medication Adderall. Patient medication last refilled 03/30/2023. Patient has Non opioid contract on file dated 02/24/2023. Patient medication pend and sent to PCP Ngetich, Donalee Citrin, NP for approval.

## 2023-05-29 ENCOUNTER — Other Ambulatory Visit: Payer: Self-pay

## 2023-05-29 DIAGNOSIS — F988 Other specified behavioral and emotional disorders with onset usually occurring in childhood and adolescence: Secondary | ICD-10-CM

## 2023-05-29 MED ORDER — AMPHETAMINE-DEXTROAMPHETAMINE 20 MG PO TABS
20.0000 mg | ORAL_TABLET | Freq: Two times a day (BID) | ORAL | 0 refills | Status: DC
Start: 2023-05-29 — End: 2023-07-01

## 2023-05-29 NOTE — Telephone Encounter (Signed)
Patient is requesting a refill of the following medications: Requested Prescriptions   Pending Prescriptions Disp Refills   amphetamine-dextroamphetamine (ADDERALL) 20 MG tablet 60 tablet 0    Sig: Take 1 tablet (20 mg total) by mouth 2 (two) times daily.    Date of last refill: 05/01/23  Refill amount: 60  Treatment agreement date: 02/19/23

## 2023-07-01 ENCOUNTER — Other Ambulatory Visit: Payer: Self-pay

## 2023-07-01 DIAGNOSIS — F988 Other specified behavioral and emotional disorders with onset usually occurring in childhood and adolescence: Secondary | ICD-10-CM

## 2023-07-01 MED ORDER — AMPHETAMINE-DEXTROAMPHETAMINE 20 MG PO TABS
20.0000 mg | ORAL_TABLET | Freq: Two times a day (BID) | ORAL | 0 refills | Status: DC
Start: 2023-07-01 — End: 2023-08-03

## 2023-07-01 NOTE — Telephone Encounter (Signed)
Patient left a voicemail stating that she needs a refill on amphetamine-dextroamphetamine (ADDERALL) to pharmacy   Patient is requesting a refill of the following medications: Requested Prescriptions   Pending Prescriptions Disp Refills   amphetamine-dextroamphetamine (ADDERALL) 20 MG tablet 60 tablet 0    Sig: Take 1 tablet (20 mg total) by mouth 2 (two) times daily.    Date of last refill: 05/29/2023  Refill amount: 0  Treatment agreement date: 02/19/23

## 2023-08-03 ENCOUNTER — Other Ambulatory Visit: Payer: Self-pay | Admitting: *Deleted

## 2023-08-03 DIAGNOSIS — F988 Other specified behavioral and emotional disorders with onset usually occurring in childhood and adolescence: Secondary | ICD-10-CM

## 2023-08-03 MED ORDER — AMPHETAMINE-DEXTROAMPHETAMINE 20 MG PO TABS
20.0000 mg | ORAL_TABLET | Freq: Two times a day (BID) | ORAL | 0 refills | Status: DC
Start: 2023-08-03 — End: 2023-09-04

## 2023-08-03 NOTE — Telephone Encounter (Signed)
Patient called and stated that she called on Friday for a refill but it was never sent to her pharmacy.   Calling again today for refill for Adderall Epic LR: 07/01/2023 Contract Date: 02/19/2023  Pended Rx and sent to [facility] for approval.

## 2023-08-25 ENCOUNTER — Ambulatory Visit: Payer: 59 | Admitting: Family

## 2023-09-04 ENCOUNTER — Other Ambulatory Visit: Payer: Self-pay

## 2023-09-04 ENCOUNTER — Ambulatory Visit: Payer: 59 | Admitting: Family

## 2023-09-04 DIAGNOSIS — F988 Other specified behavioral and emotional disorders with onset usually occurring in childhood and adolescence: Secondary | ICD-10-CM

## 2023-09-04 MED ORDER — AMPHETAMINE-DEXTROAMPHETAMINE 20 MG PO TABS: 20.0000 mg | ORAL_TABLET | Freq: Two times a day (BID) | ORAL | 0 refills | Status: DC

## 2023-09-04 NOTE — Telephone Encounter (Signed)
Patient is requesting a refill of the following medications: Requested Prescriptions   Pending Prescriptions Disp Refills   amphetamine-dextroamphetamine (ADDERALL) 20 MG tablet 60 tablet 0    Sig: Take 1 tablet (20 mg total) by mouth 2 (two) times daily.    Date of last refill: 08/03/2023  Refill amount: 60  Treatment agreement date: August 2024

## 2023-09-07 ENCOUNTER — Encounter: Payer: Self-pay | Admitting: Family

## 2023-09-07 ENCOUNTER — Ambulatory Visit (INDEPENDENT_AMBULATORY_CARE_PROVIDER_SITE_OTHER): Payer: 59 | Admitting: Family

## 2023-09-07 VITALS — BP 130/80 | HR 81 | Temp 97.6°F | Resp 20 | Ht 70.0 in | Wt 278.8 lb

## 2023-09-07 DIAGNOSIS — E782 Mixed hyperlipidemia: Secondary | ICD-10-CM | POA: Diagnosis not present

## 2023-09-07 DIAGNOSIS — F909 Attention-deficit hyperactivity disorder, unspecified type: Secondary | ICD-10-CM

## 2023-09-07 DIAGNOSIS — E559 Vitamin D deficiency, unspecified: Secondary | ICD-10-CM | POA: Diagnosis not present

## 2023-09-07 DIAGNOSIS — F988 Other specified behavioral and emotional disorders with onset usually occurring in childhood and adolescence: Secondary | ICD-10-CM | POA: Diagnosis not present

## 2023-09-07 DIAGNOSIS — Z6841 Body Mass Index (BMI) 40.0 and over, adult: Secondary | ICD-10-CM

## 2023-09-07 NOTE — Progress Notes (Signed)
 Provider: Richarda Blade FNP-C   Marvena Tally, Donalee Citrin, NP  Patient Care Team: Kairi Harshbarger, Donalee Citrin, NP as PCP - General (Family Medicine)  Extended Emergency Contact Information Primary Emergency Contact: McDough,Peg Address: [address]          [place], [place] [address] [place] Home Phone: [phone] Relation: Sister  Code Status:  Full Code  Goals of care: Advanced Directive information    09/07/2023    9:09 AM  Advanced Directives  Does Patient Have a Medical Advance Directive? No  Would patient like information on creating a medical advance directive? No - Patient declined     Chief Complaint  Patient presents with   Medical Management of Chronic Issues    6 month follow up and discuss Colonoscopy,covid,flu,and shingles.   Discussed the use of AI scribe software for clinical note transcription with the patient, who gave verbal consent to proceed.  History of Present Illness   Alexandra Proctor is a 63 year old female who presents for a six-month follow-up visit.  She has not been taking Lisembic due to its cost and has instead joined [employer] and started walking as part of her weight management efforts. Her weight has remained stable at 278.8 pounds, similar to her last visit. She experiences occasional constipation while on Lisembic, which she managed with dietary changes.  She is currently taking ibuprofen 200 mg as needed, a multivitamin, and over-the-counter vitamin D, though she is unsure of the dosage. She is not consistently taking a probiotic due to her Diet Coke consumption. She continues to take Adderall 20 mg twice daily.  She has received her flu shot and visited the eye doctor recently, with no issues noted. She has not received the COVID vaccine and had chickenpox as a toddler. She received the first shingles vaccine but did not complete the series.  She drinks a couple of glasses of alcohol on weekends and experiences cold sensations,  especially in cold weather, requiring her to wear socks or slippers to stay warm. She uses a yoga ball at work to help with her ADD and to maintain flexibility. No pain or tenderness in her abdomen, no swelling in her legs except for the ankles, and no numbness or tingling.         Past Medical History:  Diagnosis Date   ADD (attention deficit disorder) 05/02/2014   Allergy    Anemia    GERD (gastroesophageal reflux disease)    Hx of adenomatous polyp of colon 10/27/2022   Mixed hyperlipidemia 08/20/2016   Obesity 05/02/2014   Past Surgical History:  Procedure Laterality Date   COSMETIC SURGERY     GASTRIC BYPASS  2002    No Known Allergies  Allergies as of 09/07/2023   No Known Allergies      Medication List        Accurate as of September 07, 2023  9:41 AM. If you have any questions, ask your nurse or doctor.          STOP taking these medications    PROBIOTIC PO Stopped by: Donalee Citrin Osman Calzadilla   Semaglutide(0.25 or 0.5MG /DOS) 2 MG/3ML Sopn Stopped by: Anaja Monts C Everly Rubalcava       TAKE these medications    amphetamine-dextroamphetamine 20 MG tablet Commonly known as: ADDERALL Take 1 tablet (20 mg total) by mouth 2 (two) times daily.   ibuprofen 200 MG tablet Commonly known as: ADVIL Take 200 mg by mouth as needed.   multivitamin tablet  Take 1 tablet by mouth daily.   OVER THE COUNTER MEDICATION Vitamin D 3, One capsule daily.        Review of Systems  Constitutional:  Negative for appetite change, chills, fatigue, fever and unexpected weight change.  HENT:  Negative for congestion, dental problem, ear discharge, ear pain, facial swelling, hearing loss, nosebleeds, postnasal drip, rhinorrhea, sinus pressure, sinus pain, sneezing, sore throat, tinnitus and trouble swallowing.   Eyes:  Negative for pain, discharge, redness, itching and visual disturbance.  Respiratory:  Negative for cough, chest tightness, shortness of breath and wheezing.    Cardiovascular:  Negative for chest pain, palpitations and leg swelling.  Gastrointestinal:  Negative for abdominal distention, abdominal pain, blood in stool, constipation, diarrhea, nausea and vomiting.  Endocrine: Negative for cold intolerance, heat intolerance, polydipsia, polyphagia and polyuria.  Genitourinary:  Negative for difficulty urinating, dysuria, flank pain, frequency and urgency.  Musculoskeletal:  Negative for arthralgias, back pain, gait problem, joint swelling, myalgias, neck pain and neck stiffness.  Skin:  Negative for color change, pallor, rash and wound.  Neurological:  Negative for dizziness, syncope, speech difficulty, weakness, light-headedness, numbness and headaches.  Hematological:  Does not bruise/bleed easily.  Psychiatric/Behavioral:  Negative for agitation, behavioral problems, confusion, hallucinations, self-injury, sleep disturbance and suicidal ideas. The patient is not nervous/anxious.     Immunization History  Administered Date(s) Administered   Influenza,inj,Quad PF,6+ Mos 05/11/2015, 08/20/2016, 05/21/2017, 06/09/2018, 06/28/2019, 08/06/2021, 05/08/2023   Influenza-Unspecified 06/13/2022   Moderna Sars-Covid-2 Vaccination 10/14/2019, 11/11/2019   Tdap 07/14/2008, 09/17/2020, 01/08/2022   Tetanus 01/08/2022   Zoster Recombinant(Shingrix) 01/08/2022   Pertinent  Health Maintenance Due  Topic Date Due   Colonoscopy  10/15/2023   MAMMOGRAM  10/01/2023   INFLUENZA VACCINE  Completed      08/06/2021    8:29 AM 02/07/2022    8:43 AM 08/13/2022    8:51 AM 02/19/2023   11:22 AM 09/07/2023    9:09 AM  Fall Risk  Falls in the past year? 0 0 0 0 0  Was there an injury with Fall? 0 0 0 0 0  Fall Risk Category Calculator 0 0 0 0 0  Fall Risk Category (Retired) Low Low     (RETIRED) Patient Fall Risk Level Low fall risk Low fall risk     Patient at Risk for Falls Due to No Fall Risks No Fall Risks History of fall(s) No Fall Risks   Fall risk Follow up  Falls evaluation completed Falls evaluation completed Falls evaluation completed Falls evaluation completed    Functional Status Survey:    Vitals:   09/07/23 0913  BP: 130/80  Pulse: 81  Resp: 20  Temp: 97.6 F (36.4 C)  SpO2: 97%  Weight: 278 lb 12.8 oz (126.5 kg)  Height: 5\' 10"  (1.778 m)   Body mass index is 40 kg/m. Physical Exam Vitals reviewed.  Constitutional:      General: She is not in acute distress.    Appearance: Normal appearance. She is obese. She is not ill-appearing or diaphoretic.  HENT:     Head: Normocephalic.     Right Ear: Tympanic membrane, ear canal and external ear normal. There is no impacted cerumen.     Left Ear: Tympanic membrane, ear canal and external ear normal. There is no impacted cerumen.     Nose: Nose normal. No congestion or rhinorrhea.     Mouth/Throat:     Mouth: Mucous membranes are moist.     Pharynx: Oropharynx is clear.  No oropharyngeal exudate or posterior oropharyngeal erythema.  Eyes:     General: No scleral icterus.       Right eye: No discharge.        Left eye: No discharge.     Extraocular Movements: Extraocular movements intact.     Conjunctiva/sclera: Conjunctivae normal.     Pupils: Pupils are equal, round, and reactive to light.  Neck:     Vascular: No carotid bruit.  Cardiovascular:     Rate and Rhythm: Normal rate and regular rhythm.     Pulses: Normal pulses.     Heart sounds: Normal heart sounds. No murmur heard.    No friction rub. No gallop.  Pulmonary:     Effort: Pulmonary effort is normal. No respiratory distress.     Breath sounds: Normal breath sounds. No wheezing, rhonchi or rales.  Chest:     Chest wall: No tenderness.  Abdominal:     General: Bowel sounds are normal. There is no distension.     Palpations: Abdomen is soft. There is no mass.     Tenderness: There is no abdominal tenderness. There is no right CVA tenderness, left CVA tenderness, guarding or rebound.  Musculoskeletal:         General: No swelling or tenderness. Normal range of motion.     Cervical back: Normal range of motion. No rigidity or tenderness.     Right lower leg: No edema.     Left lower leg: No edema.  Lymphadenopathy:     Cervical: No cervical adenopathy.  Skin:    General: Skin is warm and dry.     Coloration: Skin is not pale.     Findings: No bruising, erythema, lesion or rash.  Neurological:     Mental Status: She is alert and oriented to person, place, and time.     Cranial Nerves: No cranial nerve deficit.     Sensory: No sensory deficit.     Motor: No weakness.     Coordination: Coordination normal.     Gait: Gait normal.  Psychiatric:        Mood and Affect: Mood normal.        Speech: Speech normal.        Behavior: Behavior normal.        Thought Content: Thought content normal.        Judgment: Judgment normal.    Labs reviewed: No results for input(s): "NA", "K", "CL", "CO2", "GLUCOSE", "BUN", "CREATININE", "CALCIUM", "MG", "PHOS" in the last 8760 hours. No results for input(s): "AST", "ALT", "ALKPHOS", "BILITOT", "PROT", "ALBUMIN" in the last 8760 hours. No results for input(s): "WBC", "NEUTROABS", "HGB", "HCT", "MCV", "PLT" in the last 8760 hours. Lab Results  Component Value Date   TSH 1.56 08/15/2022   Lab Results  Component Value Date   HGBA1C 5.0 02/28/2020   Lab Results  Component Value Date   CHOL 216 (H) 08/15/2022   HDL 48 (L) 08/15/2022   LDLCALC 141 (H) 08/15/2022   TRIG 148 08/15/2022   CHOLHDL 4.5 08/15/2022    Significant Diagnostic Results in last 30 days:  No results found.  Assessment/Plan  Obesity Weight stable at 278.8 lbs since last visit. Discontinued Ozempic due to cost. Joined [employer] and started walking. Considering compounded medications through [employer], requiring lab work. Discussed potential benefits of compounded medications and need for lab results to proceed. - Order CBC, CMP, TSH, lipid panel, and vitamin D  levels - Encourage continuation of [employer] and walking - Discuss potential for compounded medications after lab results  Hypertension Blood pressure 130/80 mmHg, within acceptable range. - Monitor blood pressure regularly  Attention Deficit Hyperactivity Disorder (ADHD) Continues Adderall 20 mg twice daily, reports effectiveness. Discussed need for urine drug test to monitor compliance. - Continue Adderall 20 mg twice daily - Perform urine drug test  Vitamin D Deficiency Taking over-the-counter vitamin D, unsure of dosage. Vitamin D levels need checking to determine if prescription necessary. Discussed potential need for 50,000 IU prescription if levels are low. - Check vitamin D levels - Consider prescribing 50,000 IU vitamin D if levels are low  General Health Maintenance Received flu shot and recent eye exam with normal results. Has not received COVID-19 vaccine and has not completed shingles vaccine series. Discussed importance of completing vaccinations. - Recommend COVID-19 vaccination at pharmacy - Advise to complete shingles vaccine series at pharmacy  Follow-up - Schedule six-month follow-up visit - Review lab results and adjust treatment plan as necessary.     Family/ staff Communication: Reviewed plan of care with patient verbalized understanding   Labs/tests ordered: CBC, CMP, TSH, lipid panel, and vitamin D levels  Next Appointment : Return in about 6 months (around 03/06/2024) for medical mangement of chronic issues.Marland Kitchen   Spent 30 minutes of Face to face and non-face to face with patient  >50% time spent counseling; reviewing medical record; tests; labs; documentation and developing future plan of care.   Caesar Bookman, NP

## 2023-09-10 LAB — DRUG MONITORING, PANEL 6 WITH CONFIRMATION, URINE
6 Acetylmorphine: NEGATIVE ng/mL (ref <10)
Alcohol Metabolites: NEGATIVE ng/mL (ref <500)
Amphetamine: 1822 ng/mL — ABNORMAL HIGH (ref <250)
Amphetamines: POSITIVE ng/mL — AB (ref <500)
Barbiturates: NEGATIVE ng/mL (ref <300)
Benzodiazepines: NEGATIVE ng/mL (ref <100)
Cocaine Metabolite: NEGATIVE ng/mL (ref <150)
Creatinine: 57.5 mg/dL (ref >=20.0)
Marijuana Metabolite: 13 ng/mL — ABNORMAL HIGH (ref <5)
Marijuana Metabolite: POSITIVE ng/mL — AB (ref <20)
Methadone Metabolite: NEGATIVE ng/mL (ref <100)
Methamphetamine: NEGATIVE ng/mL (ref <250)
Opiates: NEGATIVE ng/mL (ref <100)
Oxidant: NEGATIVE ug/mL (ref <200)
Oxycodone: NEGATIVE ng/mL (ref <100)
Phencyclidine: NEGATIVE ng/mL (ref <25)
pH: 5.9 (ref 4.5–9.0)

## 2023-09-10 LAB — DM TEMPLATE

## 2023-09-10 LAB — CBC WITH DIFFERENTIAL/PLATELET
Absolute Lymphocytes: 1508 {cells}/uL (ref 850–3900)
Absolute Monocytes: 342 {cells}/uL (ref 200–950)
Basophils Absolute: 58 {cells}/uL (ref 0–200)
Basophils Relative: 1 %
Eosinophils Absolute: 302 {cells}/uL (ref 15–500)
Eosinophils Relative: 5.2 %
HCT: 46.8 % — ABNORMAL HIGH (ref 35.0–45.0)
Hemoglobin: 15.4 g/dL (ref 11.7–15.5)
MCH: 29.1 pg (ref 27.0–33.0)
MCHC: 32.9 g/dL (ref 32.0–36.0)
MCV: 88.5 fL (ref 80.0–100.0)
MPV: 9.8 fL (ref 7.5–12.5)
Monocytes Relative: 5.9 %
Neutro Abs: 3590 {cells}/uL (ref 1500–7800)
Neutrophils Relative %: 61.9 %
Platelets: 312 10*3/uL (ref 140–400)
RBC: 5.29 10*6/uL — ABNORMAL HIGH (ref 3.80–5.10)
RDW: 13.1 % (ref 11.0–15.0)
Total Lymphocyte: 26 %
WBC: 5.8 10*3/uL (ref 3.8–10.8)

## 2023-09-10 LAB — COMPLETE METABOLIC PANEL WITH GFR
AG Ratio: 1.5 (calc) (ref 1.0–2.5)
ALT: 15 U/L (ref 6–29)
AST: 16 U/L (ref 10–35)
Albumin: 4.1 g/dL (ref 3.6–5.1)
Alkaline phosphatase (APISO): 87 U/L (ref 37–153)
BUN: 13 mg/dL (ref 7–25)
CO2: 25 mmol/L (ref 20–32)
Calcium: 9.1 mg/dL (ref 8.6–10.4)
Chloride: 105 mmol/L (ref 98–110)
Creat: 0.6 mg/dL (ref 0.50–1.05)
Globulin: 2.7 g/dL (ref 1.9–3.7)
Glucose, Bld: 112 mg/dL — ABNORMAL HIGH (ref 65–99)
Potassium: 4.4 mmol/L (ref 3.5–5.3)
Sodium: 139 mmol/L (ref 135–146)
Total Bilirubin: 0.5 mg/dL (ref 0.2–1.2)
Total Protein: 6.8 g/dL (ref 6.1–8.1)
eGFR: 102 mL/min/{1.73_m2} (ref >=60)

## 2023-09-10 LAB — LIPID PANEL
Cholesterol: 228 mg/dL — ABNORMAL HIGH (ref <200)
HDL: 53 mg/dL (ref >=50)
LDL Cholesterol (Calc): 148 mg/dL — ABNORMAL HIGH
Non-HDL Cholesterol (Calc): 175 mg/dL — ABNORMAL HIGH (ref <130)
Total CHOL/HDL Ratio: 4.3 (calc) (ref <5.0)
Triglycerides: 141 mg/dL (ref <150)

## 2023-09-10 LAB — VITAMIN D 1,25 DIHYDROXY
Vitamin D 1, 25 (OH)2 Total: 74 pg/mL — ABNORMAL HIGH (ref 18–72)
Vitamin D2 1, 25 (OH)2: <8 pg/mL
Vitamin D3 1, 25 (OH)2: 74 pg/mL

## 2023-09-10 LAB — TSH: TSH: 2.32 m[IU]/L (ref 0.40–4.50)

## 2023-09-28 ENCOUNTER — Other Ambulatory Visit: Payer: Self-pay | Admitting: Family

## 2023-09-28 DIAGNOSIS — F909 Attention-deficit hyperactivity disorder, unspecified type: Secondary | ICD-10-CM

## 2023-09-29 ENCOUNTER — Other Ambulatory Visit: Payer: Self-pay

## 2023-09-29 DIAGNOSIS — F988 Other specified behavioral and emotional disorders with onset usually occurring in childhood and adolescence: Secondary | ICD-10-CM

## 2023-09-29 MED ORDER — AMPHETAMINE-DEXTROAMPHETAMINE 20 MG PO TABS: 20.0000 mg | ORAL_TABLET | Freq: Two times a day (BID) | ORAL | 0 refills | Status: AC

## 2023-09-29 NOTE — Telephone Encounter (Signed)
 Adderall prescription refilled x 30 days.Provider will no longer prescribe Adderall due to positive drug test.ADHD to be managed by Psychiatry going forward.Psychiatry Referral already placed.

## 2024-03-12 IMAGING — MG MM DIGITAL SCREENING BILAT W/ TOMO AND CAD
6 of 10 series · 6 of 30 positions shown · non-contrast
Comparison: None.

CLINICAL DATA: Screening.

EXAM:
DIGITAL SCREENING BILATERAL MAMMOGRAM WITH TOMOSYNTHESIS AND CAD
TECHNIQUE: Bilateral screening digital craniocaudal and mediolateral oblique
mammograms were obtained. Bilateral screening digital breast
tomosynthesis was performed. The images were evaluated with
computer-aided detection.

[R MLO synth-2D (1 of 2)]
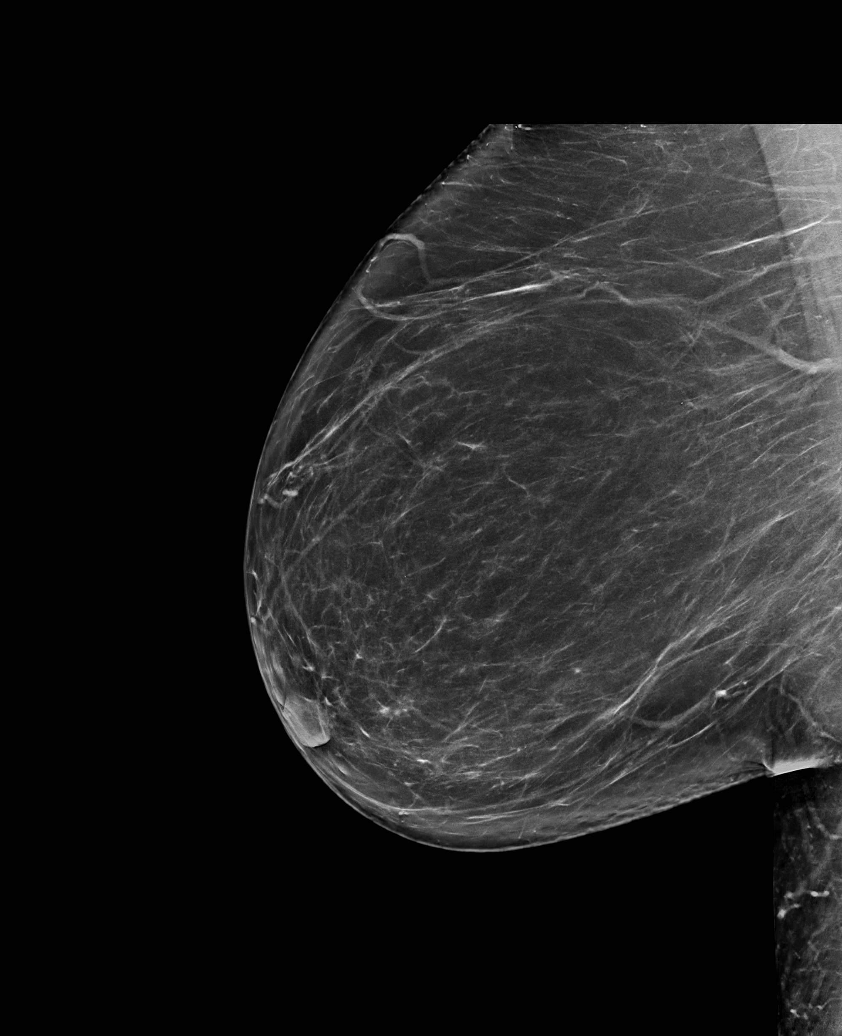

[L MLO synth-2D]
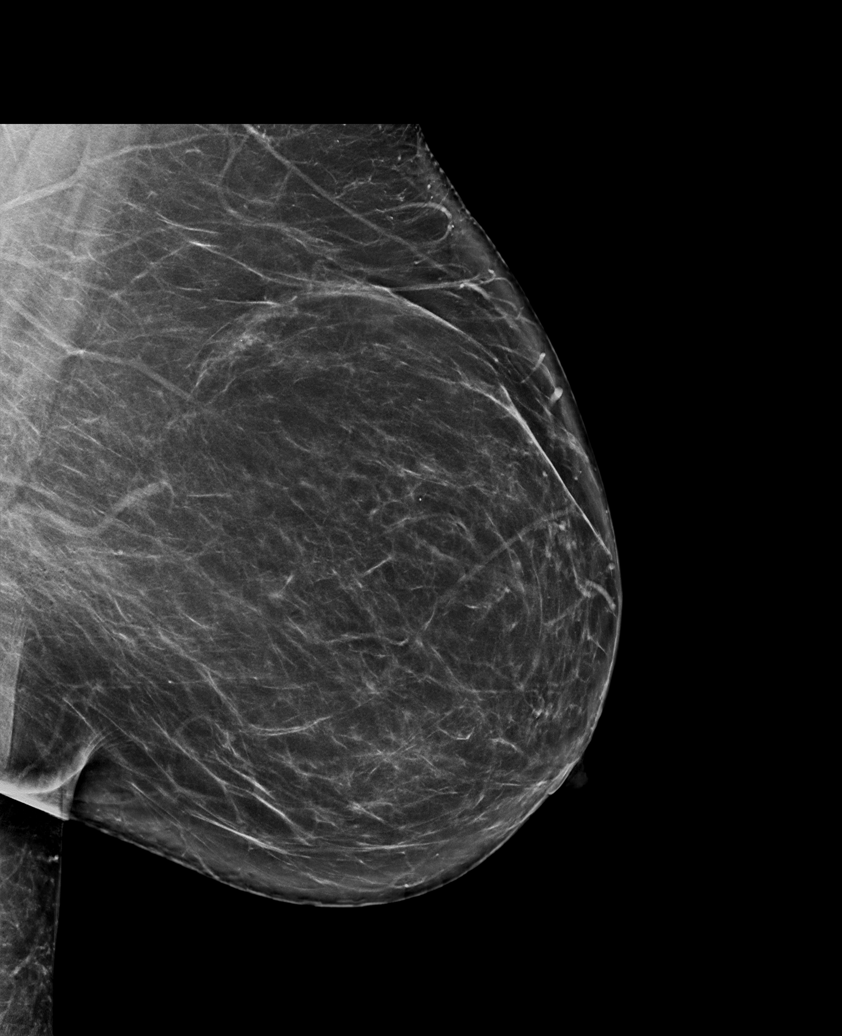

[R CC synth-2D]
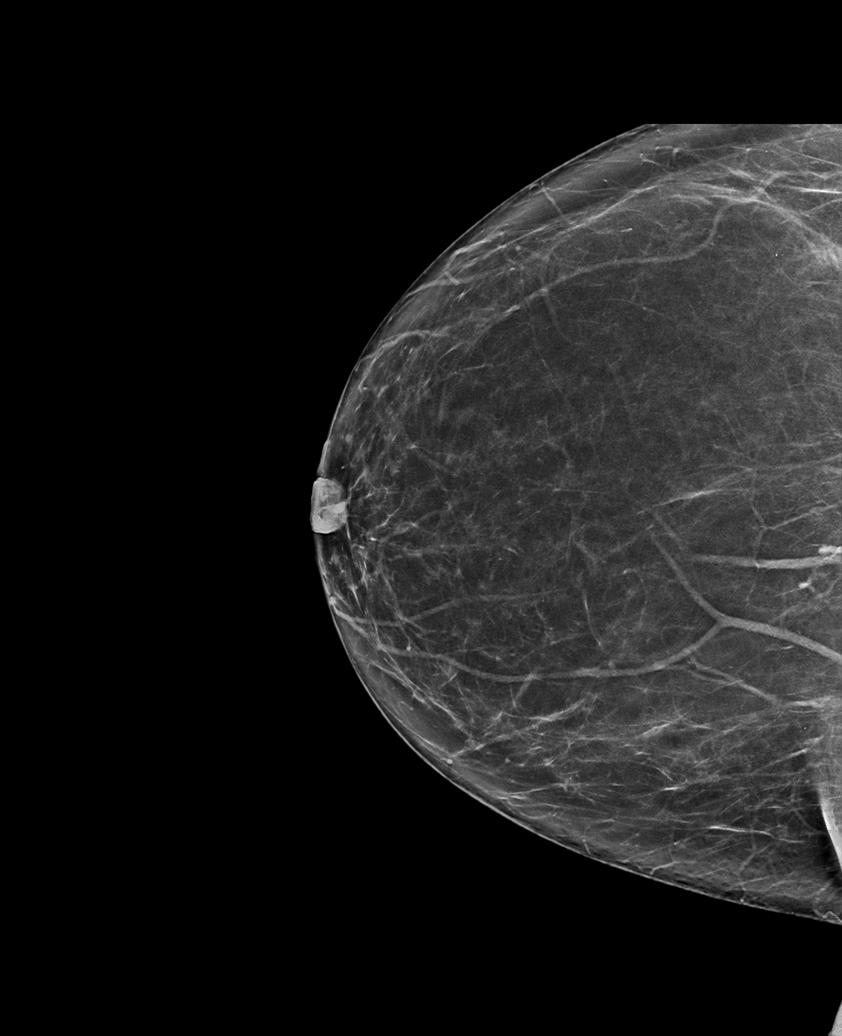

[R MLO synth-2D (2 of 2)]
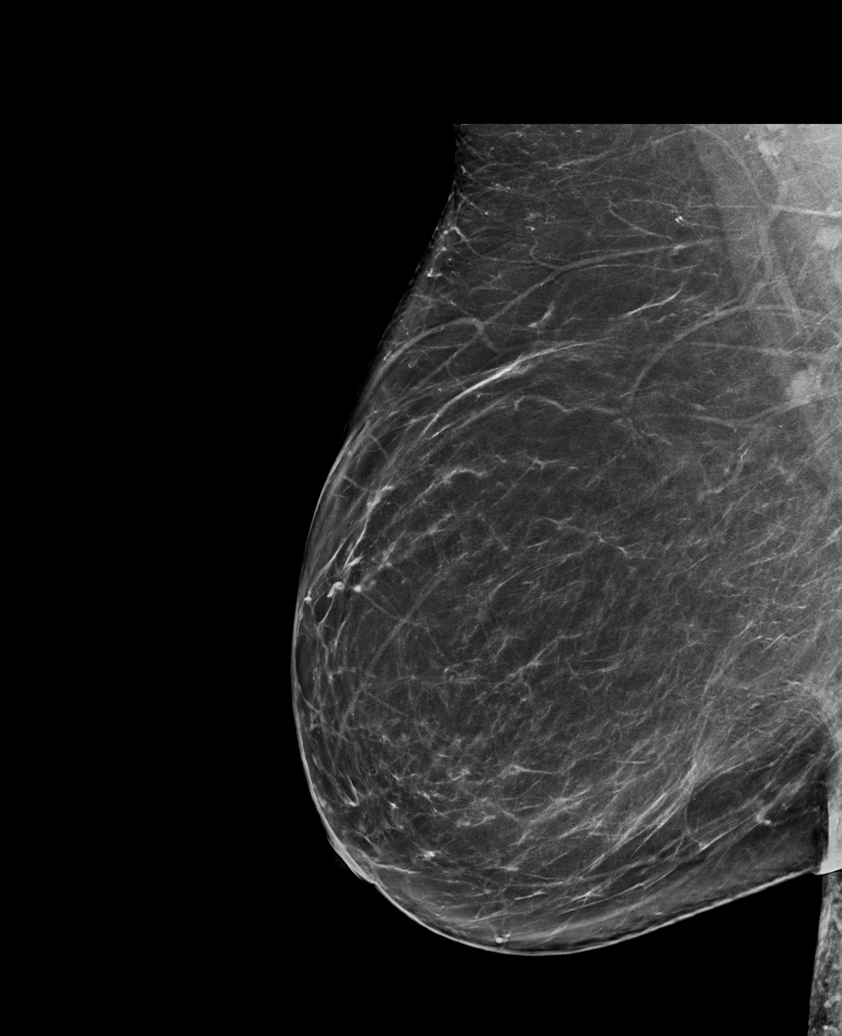

[L CC synth-2D]
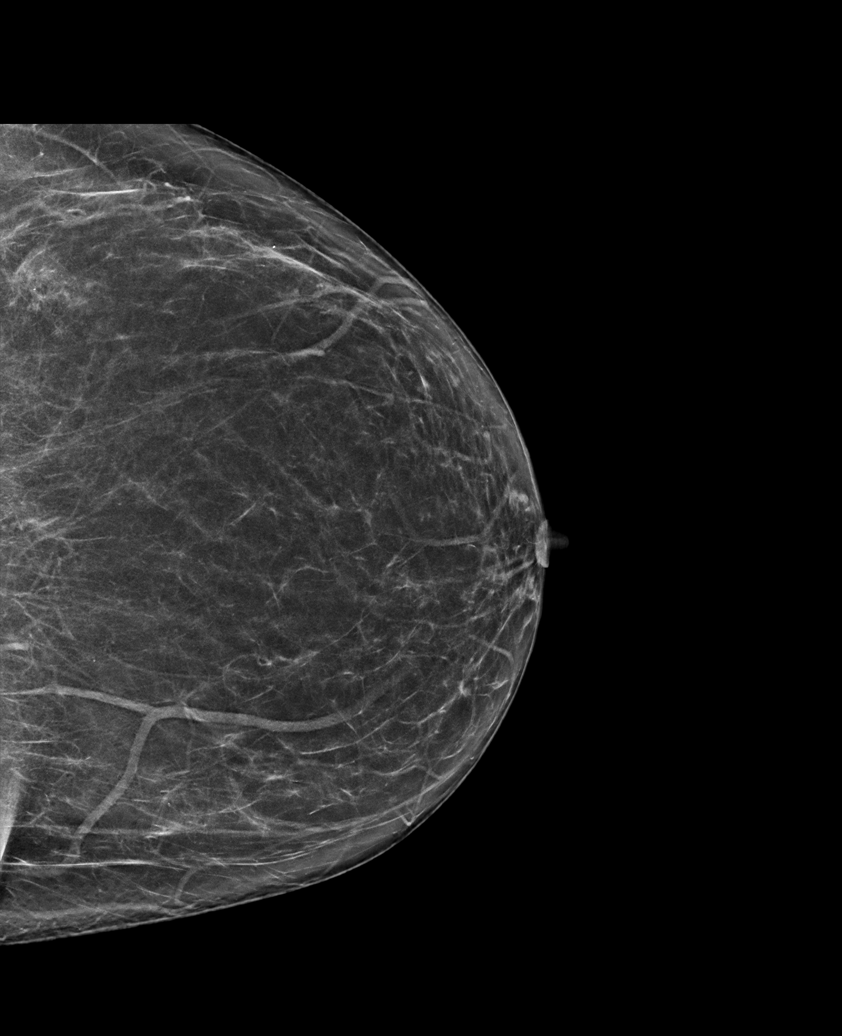

[R CC tomo · tomo slice 38/75.0]
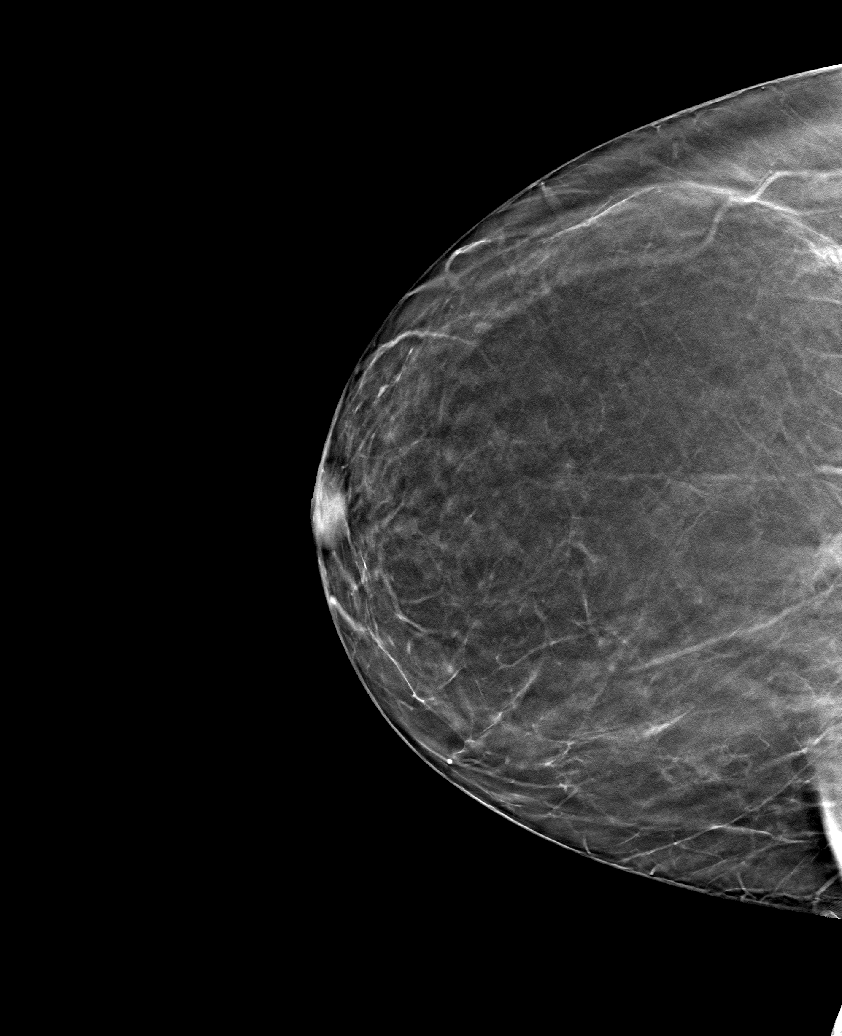

[6 of 30 positions shown; findings below may reference images not displayed]

ACR Breast Density Category b: There are scattered areas of
fibroglandular density.
FINDINGS: There are no findings suspicious for malignancy.
IMPRESSION: No mammographic evidence of malignancy. A result letter of this
screening mammogram will be mailed directly to the patient.

RECOMMENDATION:
Screening mammogram in one year. (Code:XG-X-X7B)

BI-RADS CATEGORY  1: Negative.

## 2024-03-18 ENCOUNTER — Ambulatory Visit: Payer: 59 | Admitting: Family
